# Patient Record
Sex: Female | Born: 1990 | Race: White | Hispanic: No | Marital: Single | Smoking: Current every day smoker
Health system: Southern US, Community
[De-identification: ages and names within clinical notes are randomized; demographics above are authoritative.]

## PROBLEM LIST (undated history)

## (undated) ENCOUNTER — Inpatient Hospital Stay (HOSPITAL_COMMUNITY): Payer: Self-pay

## (undated) DIAGNOSIS — N12 Tubulo-interstitial nephritis, not specified as acute or chronic: Secondary | ICD-10-CM

## (undated) DIAGNOSIS — R110 Nausea: Secondary | ICD-10-CM

## (undated) DIAGNOSIS — R109 Unspecified abdominal pain: Secondary | ICD-10-CM

## (undated) DIAGNOSIS — Z98891 History of uterine scar from previous surgery: Secondary | ICD-10-CM

## (undated) DIAGNOSIS — F329 Major depressive disorder, single episode, unspecified: Secondary | ICD-10-CM

## (undated) DIAGNOSIS — O26899 Other specified pregnancy related conditions, unspecified trimester: Secondary | ICD-10-CM

## (undated) DIAGNOSIS — Z6791 Unspecified blood type, Rh negative: Secondary | ICD-10-CM

## (undated) DIAGNOSIS — IMO0002 Reserved for concepts with insufficient information to code with codable children: Secondary | ICD-10-CM

## (undated) DIAGNOSIS — O09299 Supervision of pregnancy with other poor reproductive or obstetric history, unspecified trimester: Secondary | ICD-10-CM

## (undated) DIAGNOSIS — F419 Anxiety disorder, unspecified: Secondary | ICD-10-CM

## (undated) DIAGNOSIS — F32A Depression, unspecified: Secondary | ICD-10-CM

## (undated) HISTORY — PX: TONSILLECTOMY: SUR1361

## (undated) HISTORY — PX: ADENOIDECTOMY: SUR15

---
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
## 2011-01-27 ENCOUNTER — Other Ambulatory Visit: Payer: Self-pay | Admitting: Obstetrics and Gynecology

## 2011-01-27 ENCOUNTER — Encounter (HOSPITAL_COMMUNITY): Payer: Medicaid Other

## 2011-01-27 LAB — CBC
MCV: 93.2 fL (ref 78.0–100.0)
Platelets: 172 10*3/uL (ref 150–400)
RDW: 13.6 % (ref 11.5–15.5)
WBC: 9.6 10*3/uL (ref 4.0–10.5)

## 2011-01-27 LAB — SURGICAL PCR SCREEN: Staphylococcus aureus: NEGATIVE

## 2011-01-27 LAB — RPR: RPR Ser Ql: NONREACTIVE

## 2011-01-28 NOTE — Op Note (Signed)
[facility]  Patient:    Lynn Nolan, Lynn Nolan                   MRN: [id number] Proc. Date: 08/14/00 Adm. Date:  [id number] Attending:  Susy Frizzle CC:         Theron Arista _________, M.D.   Operative Report  PREOPERATIVE DIAGNOSIS:  Obstructive tonsils and adenoid hypertrophy.  POSTOPERATIVE DIAGNOSIS:  Obstructive tonsils and adenoid hypertrophy.  PROCEDURE:  Tonsillectomy and adenoidectomy.  SURGEON:  Jefry H. Pollyann Kennedy, M.D.  ANESTHESIA:  General endotracheal.  COMPLICATIONS:  None.  The patient tolerated the procedure well; was awakened, extubated and transferred to recovery room in stable condition.  FINDINGS:  Narrowed pharynx with moderate enlargement of the tonsils and moderate to severe enlargement of the adenoids, with obstruction of the nasopharynx.  ESTIMATED BLOOD LOSS:  10 cc.  INDICATIONS FOR PROCEDURE:  This is an 20-year-old with chronic snoring, obstructive breathing, and large tonsils and adenoids.  Risks, benefits and alternatives and complications of the procedure were explained to the parents. They seemed to understand and agreed to surgery.  DESCRIPTION OF PROCEDURE:  The patient was taken to the operating room and placed on the operating table in the supine position.  Following the induction of general endotracheal anesthesia, the table was turned 90 degrees and the patient was draped in the standard fashion.  Crowe-Davis mouth gag was inserted into the oral cavity, used to retract the tongue and mandible and attach the Mayo stand.  A red rubber catheter was inserted into the right side of the nose, withdrawn through the mouth, and used to retract the soft palate and uvula.  Inspection of the palate revealed no evidence of a submucosal cleft or shortening of the soft palate.  Indirect examination of the nasopharynx was performed, and a large adenoid curet was used to remove the adenoid tissue. The nasopharynx was then  packed.  Tonsillectomy was performed using electrocautery dissection, keeping the capsule intact.  There was minimal bleeding encountered along the dissection.  Tonsils and adenoid tissues were sent together for pathologic evaluation.  The packing was removed from the nasopharynx, and suction cautery was used to proved hemostasis in the nasopharyngeal bed.  Pharynx was suctioned of blood and secretions, irrigated with saline solution, then a nasogastric tube was used to aspirate the contents in the stomach.  The patient was then awakened, extubated and transferred to recovery. DD:  08/14/00 TD:  08/14/00 Job: [id number]

## 2011-01-28 NOTE — Discharge Summary (Signed)
Lynn Nolan, Lynn Nolan          ACCOUNT NO.:  [id number]   MEDICAL RECORD NO.:  [id number]          PATIENT TYPE:  INP   LOCATION:  0104                          FACILITY:  [facility]   PHYSICIAN:  Lalla Brothers, MDDATE OF BIRTH:  [birth year]   DATE OF ADMISSION:  06/14/2006  DATE OF DISCHARGE:  06/21/2006                                 DISCHARGE SUMMARY   IDENTIFICATION:  20 year old female ninth grade student at [employer] was admitted emergently voluntarily in transfer from [facility] emergency department for inpatient stabilization and treatment of  suicide risk and depression.  The patient had overdosed with 18 of mother's  Vicodin as a suicide attempt the week after writing a suicide note in  response to which mother was setting up outpatient care with [facility] for  therapy.  The patient has had significant conflict with mother and  significant acting out behavior especially sexual activity with older males.  The patient has used cannabis at least several times. last reportedly 2  weeks ago as well as occasional alcohol the last 2 weeks ago.  For full  details please see the typed admission assessment.   SYNOPSIS OF PRESENT ILLNESS:  The patient has more than 6 months of risk-  taking and defiant behavior.  She resides with mother and 2 sisters,  though  she stayed with a maternal aunt for 3 weeks to decompress family stress and  tension.  The patient has been skipping school and grades have become  failing this year even though she makes excellent grades on EOGs.  The  patient wishes she were not in high school but rather back in middle school.  Paternal aunt has had a suicide attempt years ago.  Mother has anxiety and  depression including panic treated with Klonopin and SSRI.  Paternal aunt  has depression and anxiety.  Maternal grandfather had substance abuse with  alcohol and maternal uncle substance abuse with alcohol and drugs.  They  gradually subsequently clarify that biological father died when the patient  was age 64 without the patient knowing him.  Father was in a bar fight and  suffered a heart attack in the process,  being found in the parking lot  brain dead.  The patient identifies with biological father and is  projectively identified as being like him by mother.  The patient and mother  have significant conflicts with the patient being angry about mother's  choice of men over the years and the consequences for the family.  However  the patient seems to need nurturing and mother seems to have some difficulty  responding, though the patient certainly alienates her..  The patient has  some exercise-induced asthma but also smokes two or three cigarettes daily  for the last month.  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  Cousins and  aunt have ADHD.  The patient is stressed about having to testify against 39-  year-old ex-boyfriend with whom she was sexually active who is being charged  about such as the patient was just 65.   INITIAL MENTAL STATUS EXAM:  The  patient has diminished interest and energy.  She had significant hopelessness and agitation.  She denied significant  anxiety.  She was fixated in oppositionality while identifying with  biological father.  She had no manic diathesis evident though family  formulated the patient has bipolar disorder.  Stepfather would not claim the  patient or speak to her and always left.   LABORATORY FINDINGS:  In [facility] emergency department, the  patient's CBC was normal except hemoglobin 15 with upper limit of normal  14.6, likely hemoconcentration.  White count was normal at 8100, hematocrit  42.9, MCV of 89 and platelet count 224,000.  Basic metabolic panel was  normal with random glucose 113, sodium 138, potassium 3.6, creatinine 0.7,  and calcium 9.6.  Acetaminophen level was initially 46.7 dropping to 39.2 3  hours later with therapeutic range  being 10-30 mcg/mL.  Blood alcohol and  urine drug screen were negative in the emergency department.  Urine  pregnancy test was negative.  Urinalysis was normal except dilute specimen  with specific gravity of less than 1.005 with pH six.  Hepatic function  panel was normal with total bilirubin 1.1, albumin 3.8, AST 14 and ALT 14  with GGT 18.  Free T4 was normal at 1.32 and TSH 1.275.  RPR was  nonreactive.  Urine probe for gonorrhea and chlamydia trichomatous by DNA  amplification were both negative   HOSPITAL COURSE AND TREATMENT:  General medical exam by Jorje Guild PA-C noted  a history of rash to amoxicillin and penicillin.  The patient had pneumonia  requiring hospitalization at age [age] and she now smokes cigarettes.  She  was menstruating at the time of admission and had some mechanical right knee  pain.  She is sexually active occasionally using contraception, had recent  GYN exam and Dr. Lilian Coma office in August 2007.  She has exercise-induced  asthma.  Her admission height was 155.5 cm and weight 61 kg.  Blood pressure  was 122/74 with heart rate of 99 sitting and 126/87 with heart rate of 115  standing.  Vital signs were normal throughout hospital stay with discharge  blood pressure 108/67 with heart rate of 66 supine and standing blood  pressure 102/74 with heart rate of 113.  Final weight was 61 kg.  The  patient was started on Wellbutrin titrated up to 300 mg XL every morning.  Albuterol inhaler was available if needed for exertional asthma.  The  patient was initially resistant to treatment process and program and was  closed to communication.  She denied depression or the need for treatment.  The patient gradually opened up in the treatment program and process.  She  noted that having both parents is very important and that the loss of her  father who left when she was 33 years of age so that she did not know him was traumatic.  She began to address more realistically the  effects of peer  pressure and sex upon relationships.  She concluded she wanted to pass out  more than suicide from the overdose and that drugs were dumb.  The patient  gradually made progress in her understanding of her symptoms so that she  could acknowledge the benefit of Wellbutrin by the time of discharge.  She  was tolerating medication well and had no side effects including no suicide  related side effects, pre seizure sign or symptom or hypomania or over  activation.  In the final family therapy session with mother,  the patient  was able to open up to mother about her disappointment with mother's choice  of men through the years and the  negative effect upon the family and  herself.  She noted to mother that she wanted to have a relationship but she  needed mother to stop talking to others about the patient's feelings and  symptoms which mother acknowledged that she does.  The patient cried as she  related these issues with mother and even more when she told mother that she  could not testify in court.  The patient ended the session telling mother  she really  wanted to work on the relationship and mother attempted to  assure the patient that everything would be okay and nothing was her fault.  Still mother had difficulty extending nurturing and the patient apparently  regressed shortly after release, becoming upset with mother when visiting  the aunt's house.  Mother called back the following day that she had held  the patient's Wellbutrin because the patient reported it was making her have  difficulty breathing and the aunt  thought she might be allergic.  The  patient then became more anxious and mother was going to give the patient  one of her Klonopin.  The patient was in [facility] emergency room  again 2 days after discharge with she and mother reporting the patient was  having panic anxiety and wanting a different medication such that the PA was  willing to  restructure medication to suit the patient and mother, while  encouraging their building of relationship for coping the way she was able  to decompress and resolve her anxiety in the emergency department.  The  patient required no seclusion or restraint during hospital stay.   FINAL DIAGNOSES:  AXIS I:  1. Major depression, single episode, severe with atypical features.  2. Oppositional defiant disorder.  3. Rule out psychoactive substance abuse not otherwise specified      (provisional diagnosis).  4. Rule out attention deficit hyperactivity disorder not otherwise      specified (provisional diagnosis).  5. Parent child problem.  6. Other specified family circumstances.  7. Other interpersonal problem  AXIS II: Diagnosis deferred.  AXIS III:  1. Exercise-induced asthma.  2. Allergy to penicillin and amoxicillin manifested by rash.  3. Vicodin overdose.  4. Limited cigarette smoking. AXIS IV: Stressors family severe acute and chronic; phase of life severe  acute and chronic; school moderate acute and chronic  AXIS V: Global assessment of functioning on admission 59 with highest in  last year estimated at 94 and discharge GAF was 52.   PLAN:  The patient was discharged to mother in improved condition free of  suicidal ideation.  Crisis and safety plans are outlined if needed.  The  patient follows a regular diet and has no restrictions on physical activity.  At the time of discharge, the patient was prescribed Wellbutrin 300 mg XL  every morning quantity #30 with one refill and her albuterol inhaler was  sent home with the patient to use 2 puffs up to every 4 hours if needed for  asthma.  They have intake with Tretha Sciara at [facility] 06/23/2006 at  11:00 a.m. at [phone].      Lalla Brothers, MD  Electronically Signed     GEJ/MEDQ  D:  06/23/2006  T:  06/25/2006  Job:  [id number]   cc:   Tretha Sciara, fax: [phone]

## 2011-01-28 NOTE — H&P (Signed)
[name], [name]          ACCOUNT NO.:  [id number]   MEDICAL RECORD NO.:  [id number]          PATIENT TYPE:  INP   LOCATION:  0104                          FACILITY:  [facility]   PHYSICIAN:  Lalla Brothers, MDDATE OF BIRTH:  22-Apr-1991   DATE OF ADMISSION:  06/14/2006  DATE OF DISCHARGE:                         PSYCHIATRIC ADMISSION ASSESSMENT   IDENTIFICATION:  A 20 year old female, ninth grade student at [employer], is admitted emergently voluntarily in transfer from  [facility] emergency department for inpatient psychiatric  stabilization and treatment of suicide risk and depression.  The patient's  transfer is delayed from time of referral by the course of medical  evaluation and certainty of stabilization.  The patient's acetaminophen  level did drop from the low 40s to the high 30s in the course of the  emergency room assessment and intervention.  She had overdosed with 18 of  mother's Vicodin as a suicide attempt, after writing a suicide note the  preceding week.  Mother has set up outpatient care at [facility] though the  patient overdosed before the scheduled appointment and became available.  The patient has significant conflict with mother and it is not possible to  establish safety or containment in a less restricted manner as determined by  the ACT team counselor in the emergency department.  The only other mental  health discussion has been with the school counselor on a very limited basis  so that essentially the patient is previously untreated.   HISTORY OF PRESENT ILLNESS:  The patient has limited trust for adults.  She  has been dating an 18 year old boyfriend in the not-too-distant past and  they were apparently sexually active when she was 54.  Legal proceedings are  underway with court next week for this boy.  The patient no longer dates  that boy and is now dating a 19 year old but is again sexually active with  him.  The  patient states that she chooses a lot of unfavorable relationships  in this way and she has nothing else to say about it except that it is hard  on her that she keeps doing it.  The patient, herself, apparently receives  few consequences that she can enumerate, except that she and mother stay in  conflict.  The patient seems to identify with biological father, though she  is living with mother and has an aunt who is supportive as well as for 69 and  89-year-old sisters.  The patient has significant externalizing and  oppositional behavior.  She initially denied any substance use but has been  intoxicated at times that she gradually acknowledges.  She has used cannabis  at least several times with last use being 2 weeks ago.  Her last use of  alcohol was 2 weeks ago.  She is smoking 2-3 cigarettes daily for the last  month.  Her period of time associating with undesirable peers and placing  herself in risk-taking situations has been greater than 6 months.  The  patient does not appreciate that she has mounting relationship and emotional  consequences from such risk-taking behavior.  She finds sensation  seeking to  continue to escalate even though consequences are present.  The patient has  become more and more dysphoric and negativistic.  She is now finding it  difficult to interest herself in things or to achieve a sense of pleasure or  satisfaction.  Her suicide attempt and ideation, may in some way tend to  identify with father's death.  Father died when she was approximately 55  years of age having gotten in a fight in a bar and then being found outside  in the parking lot having had a heart attack, likely triggered in the fight.  By the time emergency crews got to the father, he was brain dead according  to the patient.  She states she never knew her father.  There is reportedly  a family history of bipolar disorder and ADHD.  The patient dislikes school  but can do reasonably well  academically.  She would prefer to be at her old  middle school instead of the new [employer].  She alienates peers and has  frequent conflicts with peers at school as well.  She has had runaway  behavior.  The patient has had no hypomanic or manic mood and no psychotic  symptoms.  She has had no known organic central nervous system trauma.   She is on no regular medications, taking only ibuprofen when needed such as  for mechanical back pain.   PAST MEDICAL HISTORY:  1. The patient is under the primary care of [facility].  2. She has a history of heart murmur when she was young that apparently      resolved and was benign.  3. She had anemia at age 49 months with a hemoglobin as low as 5.5.  4. She has had exercise-induced asthma.  5. She had pneumonia, requiring inpatient care around age [age].  6. She subsequently had tonsillectomy and adenoidectomy.   SHE IS ALLERGIC TO:  PENICILLIN.  AMOXICILLIN MANIFESTED BY RASH.   She is sexually active and her last menses is current this month in October.   She has a mechanical back pain.  She uses ibuprofen as needed for the  mechanical back pain or other simple pains.   She does smoke 2-3 cigarettes daily for the last month.   She has had no seizure or syncope.  She has had no arrhythmia or other  traumatic brain injury.   REVIEW OF SYSTEMS:  The patient denies difficulty with gait, gaze, or  continence.  She denies headache or sensory loss currently.  She has no  memory loss or coordination deficit.  She has no known exposure to  communicable disease or toxins.  There is no rash, jaundice or purpura.  There is no chest pain, palpitations, or presyncope currently.  There is no  abdominal pain, nausea, vomiting or diarrhea.  There is no dysuria or  arthralgia.   IMMUNIZATIONS:  Up-to-date.   FAMILY HISTORY:  Mother and aunt are supportive.  She never knew biological father.  There is an aunt who attempted suicide years  ago and a family  history of bipolar disorder and ADHD.  Maternal aunt has anxiety and  depression.  Maternal grandfather has substance abuse with alcohol.  Cousins  and aunt have ADHD.  Sisters are ages four and 89.  Father had substance  abuse with alcohol and drugs.  Father died when the patient was [age],  apparently having a heart attack during a fight in a bar and dying in the  parking lot.   SOCIAL DEVELOPMENTAL HISTORY:  The patient is a ninth grade student at  [employer].  She dislikes school and wishes she can go  back to middle school.  An 48 year old ex-boyfriend is being prosecuted in  court next week for his sexual activity with the patient when she was 59  years of age.  The patient now has a 18 year old boyfriend with whom she is  also sexually active.  The patient has conflicts with mother and peers at  school.  She has unresolved grief and loss as well as unresolved social  conflicts all of which may have magnification by the patient not knowing  rather than knowing too much the course of her development.  However, the  patient is hesitant to ask questions or to look into the concerns and she  had some limited substance abuse as well as being sexually active.  She does  not know of any legal charges against herself currently.   ASSETS:  The patient is effective academically, even though there is a  family history of ADHD.  The patient reports that she loves mother even  though she has many conflicts and arguments with mother.   PHYSICAL EXAMINATION:  VITAL SIGNS:  Height is 155.5-cm and weight is 61 kg.  Blood pressure is 122/74 with a heart rate of 99 sitting, and 126/87 with  heart rate of 115 standing.  NEUROLOGIC:  She is right-handed.  She is alert and oriented with speech  intact.  Cranial nerves II-XII intact.  Muscle strength and tone are normal.  AMR is a 0/0.  There are no pathologic reflexes or soft neurologic findings.  There are no  abnormal involuntary movements.  Gait and gaze are intact are  intact.  MENTAL STATUS:  The patient is withdrawn, somber, quiet, and reserved.  There is diminished interest and energy.  She has significant hopelessness  and agitation.  She has no significant anxiety.  She is fixated in her  oppositionality and despair and identifies with deceased father in this way.  She has conflict with mother by this identification and behavior.  She has  no evidence of psychosis and no manic symptoms.  She has had suicide attempt  by overdose and had written a suicide note 1 week ago that was very angry  and mother's recapitulation but do not have the actual note yet to integrate  in therapeutic understanding.   IMPRESSION:   AXIS I:  1. Major depression, single episode, severe with atypical features.  2. Oppositional defiant disorder.  3. Parent child problem.  4. Other specified family circumstances  5. Other interpersonal problem. 6. Rule out psychoactive substance abuse, not otherwise specified      (provisional diagnosis).  7. Rule out attention deficit hyperactivity disorder, not otherwise      specified (provisional diagnosis).   AXIS II:  Diagnosis deferred.   AXIS III:  1. Exercise-induced asthma.  2. Allergy to penicillin and amoxicillin manifested by rash.  3. Limited cigarette smoking.   AXIS IV:  Stressors:  Family, severe acute and chronic; phase of life,  severe acute and chronic; school, moderate acute and chronic.   AXIS V:  Global assessment of functioning on admission 34, with the highest  in the last year estimated at 72.   PLAN:  The patient is admitted for inpatient adolescent psychiatric and  multidisciplinary multimodal behavioral treatment in a team-based  problematic locked psychiatric unit.  We will consider Wellbutrin  pharmacotherapy.  The patient is just getting over the overdose and does not  manifest definite acetaminophen or opiate toxicity currently.   The patient  emphasizes that the overdose did not kill her.  Cognitive behavioral  therapy, anger management, family therapy, substance abuse prevention,  empathy training, identity consolidation, grief and loss, and social skills  and communication training can be undertaken.  Estimated length stay is 7  days with target symptoms for discharge being stabilization of suicide risk  and mood, stabilization of disruptive behavior and resistance to therapeutic  stabilization, and generalization of the capacity for safe effective  participation in outpatient treatment.      Lalla Brothers, MD  Electronically Signed     GEJ/MEDQ  D:  06/15/2006  T:  06/16/2006  Job:  [id number]

## 2011-01-31 ENCOUNTER — Inpatient Hospital Stay (HOSPITAL_COMMUNITY)
Admission: RE | Admit: 2011-01-31 | Discharge: 2011-02-03 | DRG: 766 | Disposition: A | Discharge status: Home / Self Care | Payer: Medicaid Other | Source: Ambulatory Visit | Attending: Obstetrics and Gynecology | Admitting: Obstetrics and Gynecology

## 2011-01-31 DIAGNOSIS — O34219 Maternal care for unspecified type scar from previous cesarean delivery: Principal | ICD-10-CM | POA: Diagnosis present

## 2011-01-31 DIAGNOSIS — O3660X0 Maternal care for excessive fetal growth, unspecified trimester, not applicable or unspecified: Secondary | ICD-10-CM | POA: Diagnosis present

## 2011-02-01 LAB — CBC
HCT: 34.8 % — ABNORMAL LOW (ref 36.0–46.0)
MCHC: 33.6 g/dL (ref 30.0–36.0)
Platelets: 150 10*3/uL (ref 150–400)
RDW: 13.5 % (ref 11.5–15.5)

## 2011-02-02 LAB — RH IMMUNE GLOB WKUP(>/=20WKS)(NOT WOMEN'S HOSP): Fetal Screen: NEGATIVE

## 2011-02-02 NOTE — Op Note (Signed)
Lynn Nolan, Lynn Nolan          ACCOUNT NO.:  [id number]  MEDICAL RECORD NO.:  [id number]           PATIENT TYPE:  I  LOCATION:  9107                          [facility]:  [facility]  PHYSICIAN:  Crist Fat. Ridley Schewe, M.D. DATE OF BIRTH:  Jan 13, 1991  DATE OF PROCEDURE:  01/31/2011 DATE OF DISCHARGE:                              OPERATIVE REPORT   PREOPERATIVE DIAGNOSIS:  Intrauterine pregnancy at 40 weeks and 5 days with previous cesarean section.  POSTOPERATIVE DIAGNOSIS:  Intrauterine pregnancy at 40 weeks and 5 days with previous cesarean section.  ANESTHESIA:  Spinal, Dr. Arby Barrette.  PROCEDURE:  Repeat low transverse cesarean section.  SURGEON:  Crist Fat. Arvis Miguez, MD  ASSISTANT:  Larna Daughters, certified nurse midwife.  ESTIMATED BLOOD LOSS:  800 mL.  PROCEDURE:  After being informed of the planned procedure with possible complications including bleeding, infection, and injury to other organs, informed consent was obtained.  The patient was taken to OR #1, given spinal anesthesia without complication and placed in the dorsal decubitus position, pelvis tilted to the left.  She was prepped and draped in a sterile fashion and a Foley catheter was inserted in her bladder.  After assessing adequate level of anesthesia, we infiltrated the Pfannenstiel incision using 20 mL of Marcaine 0.25 and performed a Pfannenstiel incision which was brought down sharply to the fascia.  The fascia was incised in a low transverse fashion.  Linea alba was dissected.  Peritoneum was entered bluntly.  An Alexis retractor was easily positioned which allowed [place] to open the visceral peritoneum in a low transverse fashion so that we could safely retract bladder by developing a bladder flap.  The myometrium was then entered in a low transverse fashion first with knife, then extended bluntly.  Amniotic fluid was clear.  We assisted the birth of a female infant in vertex presentation at 3:51 p.m.  Mouth  and nose were suctioned.  Baby was delivered.  Cord was clamped with two Kelly clamps and sectioned and baby was given to the neonatologist present in the room.  10 mL of blood was drawn from the umbilical vein and the placenta was allowed to deliver spontaneously.  It was complete.  Cord has 3 vessels and uterine revision was negative.  We proceeded with closure of the myometrium in 2 layers first with a running lock suture of 0 Vicryl, then with a Lembert suture of 0 Vicryl imbricating the first one.  Hemostasis was completed in the right angle with a figure-of-eight stitch of 0 Vicryl and on peritoneal edges using cauterization.  Both paracolic gutters were cleaned.  Both tubes and ovaries were assessed and normal.  The pelvis was then profusely irrigated with warm saline to confirm a satisfactory hemostasis. Sponges and retractors were removed.  Under fascia, hemostasis was completed with cauterization and the fascia was then closed with two running sutures of #1 Vicryl meeting midline. The wound was irrigated with warm saline.  Hemostasis was completed with cauterization and the skin was closed with a subcuticular suture of 3-0 Monocryl and Steri-Strips.  Instrument and sponge count was complete x2.  Estimated blood loss was 800 mL.  The  procedure was well tolerated by the patient who was taken to recovery room in a well and stable condition.  Little boy unnamed at this time, was born at 3:51 p.m., received an Apgar of 9 at 1 minute and 9 at 5 minutes, and weight 9 pounds.  SPECIMEN:  Placenta sent to Labor and Delivery.     Crist Fat Jamisen Hawes, M.D.     SAR/MEDQ  D:  01/31/2011  T:  02/01/2011  Job:  [id number]  Electronically Signed by Silverio Lay M.D. on 02/02/2011 07:21:59 AM

## 2011-03-27 NOTE — Discharge Summary (Signed)
NAMEGITEL, Lynn Nolan          ACCOUNT NO.:  [id number]  MEDICAL RECORD NO.:  [id number]  LOCATION:  9107                          [facility]  [facility]  PHYSICIAN:  Osborn Coho, M.D.   DATE OF BIRTH:  [birth year]  DATE OF ADMISSION:  01/31/2011 DATE OF DISCHARGE:  02/03/2011                              DISCHARGE SUMMARY   HOSPITAL PROCEDURES: 1. Spinal anesthesia. 2. Repeat low transverse cesarean section. 3. Rhophylac studies.  ADMITTING DIAGNOSES: 1. Intrauterine pregnancy at 40 weeks and 5 days. 2. Previous cesarean section with desire for repeat. 3. Rh negative. 4. Group B strep negative. 5. History of anxiety and depression, on no meds. 6. Older son with history of spinal dermal cyst.  DISCHARGE DIAGNOSES: 1. Stable postoperative day #3, status post repeat low transverse     cesarean section with findings of a viable female infant born [birth year] 2011, at 15:51, birth weight 9 pounds even (4080 grams), vertex     presentation, clear fluid, and Apgars were nine at 1 minute and 9     at five minutes. 2. Large for gestational age. 3. Breastfeeding. 4. History of postpartum depression and the patient restarted on     Zoloft 50 mg p.o. daily since delivery. 5. Closely-spaced pregnancies. 45. 20-years of age  HOSPITAL COURSE:  Lynn Nolan is an 20 year old gravida 2, para 1-0-0- 1, who presented on the date of admission at 40 weeks and 5 days per an [facility] of Jan 26, 2011, for a scheduled repeat low transverse cesarean section.  Reported occasional contractions.  No leakage of fluid or vaginal bleeding.  Did report good fetal movement.  She had been followed by [facility] at [facility] with onset of care at 17 weeks and her pregnancy had been remarkable for: 1. Previous C-section for breech, desiring repeat. 2. Closely-spaced pregnancies. 3. History of postpartum depression after previous delivery, but on no     meds during the pregnancy or at the time of admission. 4. History  of Chlamydia. 5. PENICILLIN allergy. 6. Late to care. 7. Rh negative. 8. History of pyelonephritis. 9. Smoker. 10.History of anxiety.  On date of admission, she was afebrile.  Her vital signs were stable.  Her physical exam was within normal limits.  CBC on Jan 27, 2011, showed white blood cell count normal at 9.6, hemoglobin 13.5, hematocrit 39.7, and platelets were 172.  RPR was nonreactive and her preoperative MRSA PCR screen was negative.  On the date of admission, she was admitted to [facility] with routine preoperative orders under attending Dr. Dois Davenport Rivard.  Options of VBAC versus TOLAC were reviewed and risks, benefits, alternatives of these were reviewed.  Risk of surgery were discussed which included, but were not limited to: 1. Bleeding. 2. Infection. 3. Damage to surrounding organs. 4. Possible anesthesia complications.  Following this discussion, the patient continued verbalizing desire to proceed with repeat cesarean section with lack of spontaneous labor.  The patient was prepped and taken to the OR where a repeat low transverse cesarean section was performed under spinal anesthesia by Dr. Dois Davenport Rivard attending, assisted by [facility], certified nurse midwife.  Please see Dr. Cloretta Ned operative dictation note for  specifics of the surgery.  However, I will note that findings were a live female infant born Jan 31, 2010, at 15:51 at 40 weeks and 5 days, vertex presentation, clear fluid, weight 9 pounds even (4080 grams) and Apgars were nine at 1 minute and nine at 5 minutes.  The patient did tolerate the procedure well and was taken to the PACU in well and stable condition and newborn to full-term nursery.  By postoperative day #1, which was Feb 01, 2011, the patient had ambulated twice without difficulty.  She had voided since Foley was removed.  She was tolerating p.o.'s, adequate pain control with minimal incisional pain.  She was afebrile.   Vital signs were stable.  CBC on postop day #1, white count was 10.9, hemoglobin was 10.9, hematocrit 34.8, and platelets were stable at 150.  She did have hypoactive bowel sounds in her upper quadrants, active bowel sounds were present in her lower quadrants.  Incision was clean, dry and intact.  She had small rubra lochia.  Fundus was firm below umbilicus.  Routine postoperative care did continue.  Her plan was for outpatient circ as well as Nexplanon. On postop day #1 when Dr. Stefano Gaul did round on the patient, the patient did request to restart psyche medication and she was started that day on Zoloft 50 mg p.o. daily.  Did have breastfeeding/lactation consult on Feb 02, 2011, which was postop day #2. Did feel a little bit sore that day, but was using Motrin and Percocet with benefit, positive flatulence, breast feeding was going well.  She was afebrile.  Vital signs were stable.  Her physical exam was within normal limits with the exception of some fullness in her abdomen that was slightly tympanic on exam.  Steri-Strips were clean, dry and intact at incision.  Fundus was firm below umbilicus.  Scant rubra lochia.  Dr. Normand Sloop rounded on the patient right before lunch that day and the patient did complain of some difficulty focusing one of her eyes.  No scotoma. PERLLA was within normal limits and Dr. Normand Sloop did note that she would consult with Ophthalmology and probably would follow up as an outpatient regarding this complaint.  Social work did opt out of screening the patient secondary to the restart of the Zoloft postpartum.  By postoperative day #3 which was Feb 03, 2011, the patient continued doing well.  She was up without dizziness.  Pain was managed with Motrin and Percocet; nursing was going well.  She was voiding without difficulty.  Did report that she thought maybe newborn was having a possible allergy to diaper.  She remained afebrile.  Vital signs were stable.  Her  physical exam was within normal limits.  Did have some lower extremity 1+ pitting edema, but otherwise was within normal limits.  The patient was deemed to have received full benefit of the hospital stay and was discharged home postoperative day #3 on Feb 03, 2011, in stable condition. Discharge instructions were per [facility] pamphlet.  Warning signs and symptoms to report were reviewed.  DISCHARGE MEDICATIONS: 1. Zoloft 50 mg 1 tablet p.o. daily. 2. Percocet 5/325 one tablet p.o. q.3 h. or 2 tablets p.o. q.6 h.     p.r.n. moderate-to-severe pain. 3. Motrin 600 mg 1 tablet p.o. q.6 h. p.r.n. pain. 4. Colace or stool softener of choice to obtain over the counter while     on Percocet and she was to continue her prenatal vitamin 1 tablet     p.o. daily.  Additionally, the patient was given a pamphlet on postpartum depression signs and symptoms.  She was also given a discharge instruction sheet on receiving outpatient circumcision prior to newborn turning 82-month-old at [facility].  Her discharge followup for her care was to be at 6 weeks at [facility] or as needed.     Adonnis Salceda Denny Levy, CNM   ______________________________ Osborn Coho, M.D.    CHS/MEDQ  D:  03/26/2011  T:  03/27/2011  Job:  [id number]

## 2011-08-19 ENCOUNTER — Encounter: Payer: Self-pay | Admitting: *Deleted

## 2011-08-19 ENCOUNTER — Emergency Department (HOSPITAL_COMMUNITY)
Admission: EM | Admit: 2011-08-19 | Discharge: 2011-08-19 | Disposition: A | Discharge status: Home / Self Care | Payer: Medicaid Other | Attending: Emergency Medicine | Admitting: Emergency Medicine

## 2011-08-19 DIAGNOSIS — F17200 Nicotine dependence, unspecified, uncomplicated: Secondary | ICD-10-CM | POA: Insufficient documentation

## 2011-08-19 DIAGNOSIS — J111 Influenza due to unidentified influenza virus with other respiratory manifestations: Secondary | ICD-10-CM | POA: Insufficient documentation

## 2011-08-19 MED ORDER — OSELTAMIVIR PHOSPHATE 75 MG PO CAPS: 75.0000 mg | ORAL_CAPSULE | Freq: Two times a day (BID) | ORAL | Status: AC

## 2011-08-19 MED ORDER — ACETAMINOPHEN 500 MG PO TABS
1000.0000 mg | ORAL_TABLET | Freq: Once | ORAL | Status: AC
  Administered 2011-08-19: 1000 mg via ORAL
  Filled 2011-08-19: qty 2

## 2011-08-19 MED ORDER — LIDOCAINE VISCOUS 2 % MT SOLN: OROMUCOSAL | Status: DC

## 2011-08-19 NOTE — ED Notes (Signed)
Pt c/o sore throat, chest pain, and fever x 3 days

## 2011-08-19 NOTE — ED Notes (Signed)
Pt a/ox4. Resp even and unlabored. NAD at this time. D/C instruction and Rx x2 reviewed with pt. Pt verbalized understanding.

## 2011-08-19 NOTE — ED Provider Notes (Signed)
Medical screening examination/treatment/procedure(s) were performed by non-physician practitioner and as supervising physician I was immediately available for consultation/collaboration.  Donnetta Hutching, MD 08/19/11 1524

## 2011-08-19 NOTE — ED Notes (Addendum)
Pt presents with sore throat, chest pain, ans fever x 3 days. NAD at this time. Pt stable.

## 2011-08-19 NOTE — ED Provider Notes (Signed)
History     CSN: [id number] Arrival date & time: 08/19/2011  1:40 PM   First MD Initiated Contact with Patient 08/19/11 1349      Chief Complaint  Patient presents with  . Sore Throat  . Nasal Congestion  . Fever    (Consider location/radiation/quality/duration/timing/severity/associated sxs/prior treatment) Patient is a 20 y.o. female presenting with fever.  Fever Primary symptoms of the febrile illness include fever, cough and myalgias. Primary symptoms do not include headaches, shortness of breath, abdominal pain, nausea, arthralgias or rash. The current episode started 3 to 5 days ago. This is a new problem. The problem has been gradually worsening.  The fever began 3 to 5 days ago. The fever has been unchanged since its onset. The maximum temperature recorded prior to her arrival was 102 to 102.9 F.  The cough began 3 to 5 days ago. The cough is new. The cough is productive. The sputum is white.  Myalgias began 3 to 5 days ago. The myalgias have been unchanged since their onset. The myalgias are generalized. The discomfort from the myalgias is mild. The myalgias are not associated with weakness.  Risk factors: Her one year old child is currently being treated for the flu.  she has not had any other sick contacts.Primary symptoms comment: She also reports nasal congestion and severe sore throat.    History reviewed. No pertinent past medical history.  Past Surgical History  Procedure Date  . Tonsillectomy   . Cesarean section x2  . Adenoidectomy     History reviewed. No pertinent family history.  History  Substance Use Topics  . Smoking status: Current Everyday Smoker -- 0.5 packs/day    Types: Cigarettes  . Smokeless tobacco: Not on file  . Alcohol Use: No    OB History    Grav Para Term Preterm Abortions TAB SAB Ect Mult Living                  Review of Systems  Constitutional: Positive for fever.  HENT: Positive for congestion and sore throat. Negative for  neck pain.   Eyes: Negative.   Respiratory: Positive for cough. Negative for chest tightness and shortness of breath.   Cardiovascular: Negative for chest pain.  Gastrointestinal: Negative for nausea and abdominal pain.  Genitourinary: Negative.   Musculoskeletal: Positive for myalgias. Negative for joint swelling and arthralgias.  Skin: Negative.  Negative for rash and wound.  Neurological: Negative for dizziness, weakness, light-headedness, numbness and headaches.  Hematological: Negative.   Psychiatric/Behavioral: Negative.     Allergies  Amoxicillin; Erythrocin; Penicillins; and Wellbutrin  Home Medications   Current Outpatient Rx  Name Route Sig Dispense Refill  . LIDOCAINE VISCOUS 2 % MT SOLN  Gargle and spit qid prn pain 100 mL 0  . OSELTAMIVIR PHOSPHATE 75 MG PO CAPS Oral Take 1 capsule (75 mg total) by mouth 2 (two) times daily. 10 capsule 0    BP 110/67  Pulse 113  Temp(Src) 98.3 F (36.8 C) (Oral)  Ht 5\' 2"  (1.575 m)  SpO2 100%  Physical Exam  Nursing note and vitals reviewed. Constitutional: She is oriented to person, place, and time. She appears well-developed and well-nourished.  HENT:  Head: Normocephalic and atraumatic.  Eyes: Conjunctivae are normal.  Neck: Normal range of motion.  Cardiovascular: Normal rate, regular rhythm, normal heart sounds and intact distal pulses.   Pulmonary/Chest: Effort normal and breath sounds normal. No respiratory distress. She has no wheezes. She has no rales.  Occasional cough.  All lung fields are clear.  Abdominal: Soft. Bowel sounds are normal. There is no tenderness.  Musculoskeletal: Normal range of motion.  Neurological: She is alert and oriented to person, place, and time.  Skin: Skin is warm and dry.  Psychiatric: She has a normal mood and affect.    ED Course  Procedures (including critical care time)  Labs Reviewed - No data to display No results found.   1. Influenza       MDM  Tamiflu,   Lidocaine.  Rest,  Fluids,  Tylenol or motrin prn.        Candis Musa, PA 08/19/11 [phone]

## 2011-12-12 ENCOUNTER — Encounter (HOSPITAL_COMMUNITY): Payer: Self-pay | Admitting: *Deleted

## 2011-12-12 ENCOUNTER — Emergency Department (HOSPITAL_COMMUNITY)
Admission: EM | Admit: 2011-12-12 | Discharge: 2011-12-12 | Disposition: A | Discharge status: Home / Self Care | Payer: Medicaid Other | Attending: Emergency Medicine | Admitting: Emergency Medicine

## 2011-12-12 DIAGNOSIS — F17200 Nicotine dependence, unspecified, uncomplicated: Secondary | ICD-10-CM | POA: Insufficient documentation

## 2011-12-12 DIAGNOSIS — B9689 Other specified bacterial agents as the cause of diseases classified elsewhere: Secondary | ICD-10-CM | POA: Insufficient documentation

## 2011-12-12 DIAGNOSIS — N760 Acute vaginitis: Secondary | ICD-10-CM | POA: Insufficient documentation

## 2011-12-12 DIAGNOSIS — R197 Diarrhea, unspecified: Secondary | ICD-10-CM | POA: Insufficient documentation

## 2011-12-12 DIAGNOSIS — A599 Trichomoniasis, unspecified: Secondary | ICD-10-CM

## 2011-12-12 DIAGNOSIS — A499 Bacterial infection, unspecified: Secondary | ICD-10-CM | POA: Insufficient documentation

## 2011-12-12 DIAGNOSIS — R112 Nausea with vomiting, unspecified: Secondary | ICD-10-CM

## 2011-12-12 LAB — POCT I-STAT, CHEM 8
Chloride: 111 mEq/L (ref 96–112)
Creatinine, Ser: 0.9 mg/dL (ref 0.50–1.10)
HCT: 51 % — ABNORMAL HIGH (ref 36.0–46.0)
Hemoglobin: 17.3 g/dL — ABNORMAL HIGH (ref 12.0–15.0)
Potassium: 4.2 mEq/L (ref 3.5–5.1)
Sodium: 145 mEq/L (ref 135–145)

## 2011-12-12 LAB — URINALYSIS, ROUTINE W REFLEX MICROSCOPIC
Hgb urine dipstick: NEGATIVE
Nitrite: NEGATIVE
Specific Gravity, Urine: 1.01 (ref 1.005–1.030)
Urobilinogen, UA: 0.2 mg/dL (ref 0.0–1.0)
pH: 7 (ref 5.0–8.0)

## 2011-12-12 LAB — URINE MICROSCOPIC-ADD ON

## 2011-12-12 MED ORDER — METRONIDAZOLE 500 MG PO TABS: 500.0000 mg | ORAL_TABLET | Freq: Two times a day (BID) | ORAL | Status: AC

## 2011-12-12 MED ORDER — ONDANSETRON 4 MG PO TBDP: 4.0000 mg | ORAL_TABLET | Freq: Four times a day (QID) | ORAL | Status: AC | PRN

## 2011-12-12 MED ORDER — ONDANSETRON HCL 4 MG/2ML IJ SOLN
4.0000 mg | INTRAMUSCULAR | Status: AC | PRN
  Administered 2011-12-12 (×2): 4 mg via INTRAVENOUS
  Filled 2011-12-12 (×2): qty 2

## 2011-12-12 MED ORDER — FAMOTIDINE IN NACL 20-0.9 MG/50ML-% IV SOLN
20.0000 mg | Freq: Once | INTRAVENOUS | Status: AC
  Administered 2011-12-12: 20 mg via INTRAVENOUS
  Filled 2011-12-12: qty 50

## 2011-12-12 MED ORDER — SODIUM CHLORIDE 0.9 % IV SOLN
Freq: Once | INTRAVENOUS | Status: AC
  Administered 2011-12-12: 03:00:00 via INTRAVENOUS

## 2011-12-12 MED ORDER — ONDANSETRON 8 MG PO TBDP
8.0000 mg | ORAL_TABLET | Freq: Once | ORAL | Status: AC
  Administered 2011-12-12: 8 mg via ORAL
  Filled 2011-12-12: qty 1

## 2011-12-12 NOTE — ED Provider Notes (Signed)
History     CSN: [id number]  Arrival date & time 12/12/11  0200   First MD Initiated Contact with Patient 12/12/11 [phone]      Chief Complaint  Patient presents with  . Nausea  . Emesis  . Diarrhea    HPI Pt was seen at 0250.  Per pt, c/o gradual onset and persistence of multiple intermittent episodes of N/V/D since last night.  Has been associated with generalized abd "cramping" pain.  +children in household with same symptoms.  Denies black or blood in stools or emesis, no back pain, no dysuria, no fevers.   History reviewed. No pertinent past medical history.  Past Surgical History  Procedure Date  . Tonsillectomy   . Cesarean section x2  . Adenoidectomy     History  Substance Use Topics  . Smoking status: Current Everyday Smoker -- 0.5 packs/day    Types: Cigarettes  . Smokeless tobacco: Not on file  . Alcohol Use: No    Review of Systems ROS: Statement: All systems negative except as marked or noted in the HPI; Constitutional: Negative for fever and chills. ; ; Eyes: Negative for eye pain, redness and discharge. ; ; ENMT: Negative for ear pain, hoarseness, nasal congestion, sinus pressure and sore throat. ; ; Cardiovascular: Negative for chest pain, palpitations, diaphoresis, dyspnea and peripheral edema. ; ; Respiratory: Negative for cough, wheezing and stridor. ; ; Gastrointestinal: +N/V/D. Negative for abdominal pain, blood in stool, hematemesis, jaundice and rectal bleeding. . ; ; Genitourinary: Negative for dysuria, flank pain and hematuria. ; ; Musculoskeletal: Negative for back pain and neck pain. Negative for swelling and trauma.; ; Skin: Negative for pruritus, rash, abrasions, blisters, bruising and skin lesion.; ; Neuro: Negative for headache, lightheadedness and neck stiffness. Negative for weakness, altered level of consciousness , altered mental status, extremity weakness, paresthesias, involuntary movement, seizure and syncope.     Allergies  Amoxicillin;  Erythrocin; Penicillins; and Wellbutrin  Home Medications  No current outpatient prescriptions on file.  BP 108/87  Pulse 137  Temp 97.3 F (36.3 C)  Resp 22  Ht 5\' 2"  (1.575 m)  Wt 165 lb (74.844 kg)  BMI 30.18 kg/m2  SpO2 100%  LMP 12/09/2011  Physical Exam 0255: Physical examination:  Nursing notes reviewed; Vital signs and O2 SAT reviewed;  Constitutional: Well developed, Well nourished, Well hydrated, In no acute distress; Head:  Normocephalic, atraumatic; Eyes: EOMI, PERRL, No scleral icterus; ENMT: Mouth and pharynx normal, Mucous membranes moist; Neck: Supple, Full range of motion, No lymphadenopathy; Cardiovascular: Regular rate and rhythm, No murmur, rub, or gallop; Respiratory: Breath sounds clear & equal bilaterally, No rales, rhonchi, wheezes, or rub, Normal respiratory effort/excursion; Chest: Nontender, Movement normal; Abdomen: Soft, Nontender, Nondistended, Normal bowel sounds; Extremities: Pulses normal, No tenderness, No edema, No calf edema or asymmetry.; Neuro: AA&Ox3, Major CN grossly intact.  No gross focal motor or sensory deficits in extremities.; Skin: Color normal, Warm, Dry    ED Course  Procedures    MDM  MDM Reviewed: nursing note and vitals Interpretation: labs     Results for orders placed during the hospital encounter of 12/12/11  POCT PREGNANCY, URINE      Component Value Range   Preg Test, Ur NEGATIVE  NEGATIVE   POCT I-STAT, CHEM 8      Component Value Range   Sodium 145  135 - 145 (mEq/L)   Potassium 4.2  3.5 - 5.1 (mEq/L)   Chloride 111  96 - 112 (mEq/L)  BUN 12  6 - 23 (mg/dL)   Creatinine, Ser 2.13  0.50 - 1.10 (mg/dL)   Glucose, Bld 086 (*) 70 - 99 (mg/dL)   Calcium, Ion 5.78  4.69 - 1.32 (mmol/L)   TCO2 21  0 - 100 (mmol/L)   Hemoglobin 17.3 (*) 12.0 - 15.0 (g/dL)   HCT 62.9 (*) 52.8 - 46.0 (%)  URINALYSIS, ROUTINE W REFLEX MICROSCOPIC      Component Value Range   Color, Urine YELLOW  YELLOW    APPearance CLEAR  CLEAR     Specific Gravity, Urine 1.010  1.005 - 1.030    pH 7.0  5.0 - 8.0    Glucose, UA NEGATIVE  NEGATIVE (mg/dL)   Hgb urine dipstick NEGATIVE  NEGATIVE    Bilirubin Urine NEGATIVE  NEGATIVE    Ketones, ur >80 (*) NEGATIVE (mg/dL)   Protein, ur NEGATIVE  NEGATIVE (mg/dL)   Urobilinogen, UA 0.2  0.0 - 1.0 (mg/dL)   Nitrite NEGATIVE  NEGATIVE    Leukocytes, UA SMALL (*) NEGATIVE   URINE MICROSCOPIC-ADD ON      Component Value Range   Squamous Epithelial / LPF MANY (*) RARE    WBC, UA 3-6  <3 (WBC/hpf)   RBC / HPF 0-2  <3 (RBC/hpf)   Bacteria, UA FEW (*) RARE    Urine-Other TRICHOMONAS PRESENT       5:41 AM:  Pt has tol PO well while in ED.  No N/V or stooling while in ED.  Feels "better" and wants to go home.  UA with +trich and +clue cells, will tx for both with flagyl rx.  REFUSES pelvic exam.  Pt cautioned regarding the possibility of GC/chlam exposure given +trich, pt continues to refuse pelvic exam.  Pt makes her own medical decision.  Wants to go home now.  Dx testing d/w pt.  Questions answered.  Verb understanding, agreeable to d/c home with outpt f/u with OB/GYN for pelvic exam and further STD check.           Laray Anger, DO 12/12/11 1721

## 2011-12-12 NOTE — ED Notes (Signed)
Initial urine sample sent was quantity insufficient - patient informed of need for another specimen when she feels she can void

## 2011-12-12 NOTE — ED Notes (Signed)
Awaiting return of patients mother to determine if she will be staying with patient at home today.

## 2011-12-12 NOTE — ED Notes (Signed)
Water given for po fluid challenge.

## 2011-12-12 NOTE — Discharge Instructions (Signed)
RESOURCE GUIDE  Dental Problems  Patients with Medicaid: [facility]                     [facility] [address]                                           [address] Phone:  [phone]                                                  Phone:  [phone]  If unable to pay or uninsured, contact:  [facility] or [facility] to become qualified for the adult dental clinic.  Chronic Pain Problems Contact [facility]  [phone] Patients need to be referred by their primary care doctor.  Insufficient Money for Medicine Contact [employer]:  call "211" or [facility] 271-5999.  No Primary Care Doctor Call [facility]  832-8000 Other agencies that provide inexpensive medical care    [facility]  [phone]    [facility]  [phone]    [facility]  [phone]    Women's Clinic  [phone]    [facility]  [phone]    [facility]  [phone]  Psychological Services [facility]  [phone] [facility]  [phone] [facility]   800 853-5163 (emergency services 641-4993)  Substance Abuse Resources [facility]  336-882-2125 [facility] 336-784-9470 [facility] 336-285-9073 [facility] 336-845-3988 Residential & Outpatient Substance Abuse Program  800-659-3381  Abuse/Neglect [place] Child Abuse Hotline (336) 641-3795 [place] Child Abuse Hotline 800-378-5315 (After Hours)  Emergency Shelter [facility] (336) 271-5985  Maternity Homes [facility] (336) 275-9566 [facility] (704) 372-4663  MRSA Hotline #:   832-7006    [place] Resources  [facility] [place]     [employer]                          [facility] [address] [place]                       [address]      [address]  [place]                                                [place]                            [place] Phone:  [phone]                                   Phone:  [phone]                 Phone:  [phone]  [facility] Phone:  [phone]    [facility] Child Abuse Hotline 217-075-0700 (715)281-3038 (After Hours)   Take the prescriptions as directed.  Increase your fluid intake (ie:  Gatoraide) for the next few days, as discussed.  Eat a bland diet and advance to your regular diet slowly as you can tolerate it.   Avoid full strength juices, as well as milk and milk products until your diarrhea has resolved.   Call your regular medical doctor and your OB/GYN doctor today to schedule a follow up appointment this week.  Return to the Emergency Department immediately if not improving (or even worsening) despite taking the medicines as prescribed, any black or bloody stool or vomit, if you develop a fever, or for any other concerns.

## 2011-12-12 NOTE — ED Notes (Signed)
Pt c/o n/v/d/ since last night around 8:30

## 2012-03-10 ENCOUNTER — Encounter (HOSPITAL_COMMUNITY): Payer: Self-pay | Admitting: Emergency Medicine

## 2012-03-10 ENCOUNTER — Emergency Department (HOSPITAL_COMMUNITY)
Admission: EM | Admit: 2012-03-10 | Discharge: 2012-03-10 | Disposition: A | Discharge status: Home / Self Care | Payer: Medicaid Other | Attending: Emergency Medicine | Admitting: Emergency Medicine

## 2012-03-10 ENCOUNTER — Emergency Department (HOSPITAL_COMMUNITY): Payer: Medicaid Other

## 2012-03-10 DIAGNOSIS — M79603 Pain in arm, unspecified: Secondary | ICD-10-CM

## 2012-03-10 DIAGNOSIS — F17200 Nicotine dependence, unspecified, uncomplicated: Secondary | ICD-10-CM | POA: Insufficient documentation

## 2012-03-10 DIAGNOSIS — M79609 Pain in unspecified limb: Secondary | ICD-10-CM | POA: Insufficient documentation

## 2012-03-10 HISTORY — DX: Major depressive disorder, single episode, unspecified: F32.9

## 2012-03-10 HISTORY — DX: Depression, unspecified: F32.A

## 2012-03-10 HISTORY — DX: Anxiety disorder, unspecified: F41.9

## 2012-03-10 NOTE — ED Notes (Signed)
Patient c/o left arm pain after falling. Per patient "feels like my birth control the nexplanon exploded in my arm." Per patient arm swelling, tender, and bruised where implant is.

## 2012-03-10 NOTE — ED Provider Notes (Signed)
Medical screening examination/treatment/procedure(s) were performed by non-physician practitioner and as supervising physician I was immediately available for consultation/collaboration. Contessa Preuss, MD, FACEP   Evangaline Jou L Felisia Balcom, MD 03/10/12 1619 

## 2012-03-10 NOTE — Discharge Instructions (Signed)
Your xrays are negative for acute problem. Please return if any changes or problem.

## 2012-03-10 NOTE — ED Provider Notes (Signed)
History     CSN: [id number]  Arrival date & time 03/10/12  1329   First MD Initiated Contact with Patient 03/10/12 1402      Chief Complaint  Patient presents with  . Fall  . Arm Pain    (Consider location/radiation/quality/duration/timing/severity/associated sxs/prior treatment) HPI Comments: Patient states that on yesterday she fell out of a chair and injured the left arm. The patient has a birth control device (nexplanon) in the left arm. Since the time of the fall she has been experiencing a burning sensation and a sensation of swelling at the area near the birth control device. She requests to have this evaluated. She also requests to be checked for possible fractures or dislocations following the fall. The patient denies any problem using the arm. She's not had any swelling or pain in any other sites.  Patient is a 21 y.o. female presenting with fall and arm pain. The history is provided by the patient.  Fall Pertinent negatives include no abdominal pain and no hematuria.  Arm Pain Pertinent negatives include no abdominal pain, arthralgias, chest pain, coughing or neck pain.    Past Medical History  Diagnosis Date  . Anxiety   . Depression     Past Surgical History  Procedure Date  . Tonsillectomy   . Cesarean section x2  . Adenoidectomy     History reviewed. No pertinent family history.  History  Substance Use Topics  . Smoking status: Current Everyday Smoker -- 0.7 packs/day for 5 years    Types: Cigarettes  . Smokeless tobacco: Never Used  . Alcohol Use: No    OB History    Grav Para Term Preterm Abortions TAB SAB Ect Mult Living   2 2 2       2       Review of Systems  Constitutional: Negative for activity change.       All ROS Neg except as noted in HPI  HENT: Negative for nosebleeds and neck pain.   Eyes: Negative for photophobia and discharge.  Respiratory: Negative for cough, shortness of breath and wheezing.   Cardiovascular: Negative for  chest pain and palpitations.  Gastrointestinal: Negative for abdominal pain and blood in stool.  Genitourinary: Negative for dysuria, frequency and hematuria.  Musculoskeletal: Negative for back pain and arthralgias.  Skin: Negative.   Neurological: Negative for dizziness, seizures and speech difficulty.  Psychiatric/Behavioral: Negative for hallucinations and confusion. The patient is nervous/anxious.     Allergies  Amoxicillin; Apple; Erythrocin; Penicillins; and Wellbutrin  Home Medications   Current Outpatient Rx  Name Route Sig Dispense Refill  . ETONOGESTREL 68 MG [place] IMPL Subcutaneous Inject 1 each into the skin once.      BP 115/72  Pulse 86  Temp 98.8 F (37.1 C) (Oral)  Resp 17  Ht 5\' 2"  (1.575 m)  SpO2 99%  LMP 01/25/2012  Physical Exam  Nursing note and vitals reviewed. Constitutional: She is oriented to person, place, and time. She appears well-developed and well-nourished.  Non-toxic appearance.  HENT:  Head: Normocephalic.  Right Ear: Tympanic membrane and external ear normal.  Left Ear: Tympanic membrane and external ear normal.  Eyes: EOM and lids are normal. Pupils are equal, round, and reactive to light.  Neck: Normal range of motion. Neck supple. Carotid bruit is not present.  Cardiovascular: Normal rate, regular rhythm, normal heart sounds, intact distal pulses and normal pulses.   Pulmonary/Chest: Breath sounds normal. No respiratory distress.  Abdominal: Soft. Bowel sounds are normal.  There is no tenderness. There is no guarding.  Musculoskeletal: Normal range of motion.       The patient has full range of motion of the left shoulder. Is full range of motion of the left elbow. There is a palpable birth control device in the lower triceps area on the left. There is no hematoma present. The area is not hot. There is full range of motion of the left wrist and fingers. Good capillary refill.  Lymphadenopathy:       Head (right side): No submandibular  adenopathy present.       Head (left side): No submandibular adenopathy present.    She has no cervical adenopathy.  Neurological: She is alert and oriented to person, place, and time. She has normal strength. No cranial nerve deficit or sensory deficit.  Skin: Skin is warm and dry.  Psychiatric: She has a normal mood and affect. Her speech is normal.    ED Course  Procedures (including critical care time)  Labs Reviewed - No data to display Dg Humerus Left  03/10/2012  *RADIOLOGY REPORT*  Clinical Data: Fall.  LEFT HUMERUS - 2+ VIEW  Comparison: None  Findings: There is no evidence of fracture or dislocation.  There is no evidence of arthropathy or other focal bone abnormality. Radio- opaque foreign body within the soft tissues of the left upper extremity compatible with birth control device appears intact without evidence for fracture.  IMPRESSION: Negative exam.  Original Report Authenticated By: Rosealee Albee, M.D.   Pulse oximetry 99% on room air. Within normal limits by my interpretation.  1. Arm pain       MDM  I have reviewed nursing notes, vital signs, and all appropriate lab and imaging results for this patient.  The x-ray of the left humerus area reveals the birth control device intact. There is no fracture or dislocation appreciated. Patient reassured of the x-ray findings. She is invited to return to the emergency department if any changes, problems, or concerns.      Kathie Dike, [place] 03/10/12 1440

## 2012-06-08 ENCOUNTER — Ambulatory Visit
Admission: RE | Admit: 2012-06-08 | Discharge: 2012-06-08 | Disposition: A | Discharge status: Home / Self Care | Payer: Medicaid Other | Source: Ambulatory Visit | Attending: Family Medicine | Admitting: Family Medicine

## 2012-06-08 ENCOUNTER — Other Ambulatory Visit: Payer: Self-pay | Admitting: Family Medicine

## 2012-06-08 DIAGNOSIS — R059 Cough, unspecified: Secondary | ICD-10-CM

## 2012-06-08 DIAGNOSIS — R05 Cough: Secondary | ICD-10-CM

## 2012-06-08 DIAGNOSIS — R079 Chest pain, unspecified: Secondary | ICD-10-CM

## 2012-06-25 ENCOUNTER — Ambulatory Visit (HOSPITAL_COMMUNITY): Payer: Medicaid Other | Attending: Cardiology

## 2013-04-29 ENCOUNTER — Emergency Department (HOSPITAL_COMMUNITY)
Admission: EM | Admit: 2013-04-29 | Discharge: 2013-04-29 | Disposition: A | Discharge status: Home / Self Care | Payer: Medicaid Other | Attending: Emergency Medicine | Admitting: Emergency Medicine

## 2013-04-29 ENCOUNTER — Encounter (HOSPITAL_COMMUNITY): Payer: Self-pay | Admitting: Emergency Medicine

## 2013-04-29 DIAGNOSIS — O219 Vomiting of pregnancy, unspecified: Secondary | ICD-10-CM

## 2013-04-29 DIAGNOSIS — O2390 Unspecified genitourinary tract infection in pregnancy, unspecified trimester: Secondary | ICD-10-CM | POA: Insufficient documentation

## 2013-04-29 DIAGNOSIS — B9689 Other specified bacterial agents as the cause of diseases classified elsewhere: Secondary | ICD-10-CM | POA: Insufficient documentation

## 2013-04-29 DIAGNOSIS — O210 Mild hyperemesis gravidarum: Secondary | ICD-10-CM | POA: Insufficient documentation

## 2013-04-29 DIAGNOSIS — F17200 Nicotine dependence, unspecified, uncomplicated: Secondary | ICD-10-CM | POA: Insufficient documentation

## 2013-04-29 DIAGNOSIS — R109 Unspecified abdominal pain: Secondary | ICD-10-CM | POA: Insufficient documentation

## 2013-04-29 DIAGNOSIS — Z880 Allergy status to penicillin: Secondary | ICD-10-CM | POA: Insufficient documentation

## 2013-04-29 DIAGNOSIS — N390 Urinary tract infection, site not specified: Secondary | ICD-10-CM | POA: Insufficient documentation

## 2013-04-29 DIAGNOSIS — Z8659 Personal history of other mental and behavioral disorders: Secondary | ICD-10-CM | POA: Insufficient documentation

## 2013-04-29 DIAGNOSIS — A499 Bacterial infection, unspecified: Secondary | ICD-10-CM | POA: Insufficient documentation

## 2013-04-29 LAB — BASIC METABOLIC PANEL
BUN: 5 mg/dL — ABNORMAL LOW (ref 6–23)
CO2: 20 mEq/L (ref 19–32)
Chloride: 106 mEq/L (ref 96–112)
Glucose, Bld: 98 mg/dL (ref 70–99)
Potassium: 3.7 mEq/L (ref 3.5–5.1)

## 2013-04-29 LAB — URINALYSIS, ROUTINE W REFLEX MICROSCOPIC
Bilirubin Urine: NEGATIVE
Glucose, UA: NEGATIVE mg/dL
Ketones, ur: 15 mg/dL — AB
Protein, ur: NEGATIVE mg/dL
pH: 8.5 — ABNORMAL HIGH (ref 5.0–8.0)

## 2013-04-29 MED ORDER — ONDANSETRON 4 MG PO TBDP: ORAL_TABLET | ORAL | Status: DC

## 2013-04-29 MED ORDER — SODIUM CHLORIDE 0.9 % IV BOLUS (SEPSIS)
1000.0000 mL | Freq: Once | INTRAVENOUS | Status: AC
  Administered 2013-04-29: 1000 mL via INTRAVENOUS

## 2013-04-29 MED ORDER — ONDANSETRON HCL 4 MG/2ML IJ SOLN
4.0000 mg | Freq: Once | INTRAMUSCULAR | Status: AC
  Administered 2013-04-29: 4 mg via INTRAVENOUS
  Filled 2013-04-29: qty 2

## 2013-04-29 NOTE — ED Provider Notes (Signed)
CSN: [id number]     Arrival date & time 04/29/13  1359 History  This chart was scribed for Lynn Skeens, MD by Leone Payor, ED Scribe. This patient was seen in room APA12/APA12 and the patient's care was started 2:26 PM.    Chief Complaint  Patient presents with  . Abdominal Cramping    The history is provided by the patient. No language interpreter was used.    HPI Comments: Lynn Nolan is a 22 y.o. female who presents to the Emergency Department complaining of constant, unchanged abdominal pain described as cramping that started about 1 week ago. She states the cramping is similar to menstrual cramps. Pt reports having associated nausea and vomiting that started about 2 weeks ago. Pt states the cramping is aggravated by sitting or standing and is alleviated by laying down. Pt is about [redacted] weeks pregnant but she has not had an ultrasound. She reports having a history of UTI. She has history of 2 cesarean sections for her previous pregnancies. She denies HA, vision changes, neck pain, cough, vaginal bleeding.    Past Medical History  Diagnosis Date  . Anxiety   . Depression    Past Surgical History  Procedure Laterality Date  . Tonsillectomy    . Cesarean section  x2  . Adenoidectomy     No family history on file. History  Substance Use Topics  . Smoking status: Current Every Day Smoker -- 0.75 packs/day for 5 years    Types: Cigarettes  . Smokeless tobacco: Never Used  . Alcohol Use: No   OB History   Grav Para Term Preterm Abortions TAB SAB Ect Mult Living   3 2 2       2      Review of Systems  HENT: Negative for neck pain.   Eyes: Negative for visual disturbance.  Respiratory: Negative for cough.   Gastrointestinal: Positive for nausea, vomiting and abdominal pain (cramping).  Genitourinary: Negative for vaginal bleeding.  Neurological: Negative for headaches.  All other systems reviewed and are negative.    Allergies  Amoxicillin; Apple; Erythrocin;  Penicillins; and Wellbutrin  Home Medications   Current Outpatient Rx  Name  Route  Sig  Dispense  Refill  . etonogestrel (NEXPLANON) 68 MG IMPL implant   Subcutaneous   Inject 1 each into the skin once.          Pulse 72  Temp(Src) 97.2 F (36.2 C) (Oral)  Resp 20  Ht 5\' 3"  (1.6 m)  Wt 140 lb (63.504 kg)  BMI 24.81 kg/m2  SpO2 100%  LMP 03/08/2012 Physical Exam  Nursing note and vitals reviewed. Constitutional: She is oriented to person, place, and time. She appears well-developed and well-nourished.  HENT:  Head: Atraumatic. Macrocephalic.  Mouth/Throat: Mucous membranes are dry.  Eyes: Conjunctivae and EOM are normal. Pupils are equal, round, and reactive to light. No scleral icterus.  Neck: Normal range of motion. Neck supple.  Cardiovascular: Normal rate, regular rhythm and normal heart sounds.   No murmur heard. Good pulses.   Pulmonary/Chest: Effort normal and breath sounds normal.  Abdominal: Soft. Bowel sounds are normal. There is no guarding.  No focal tenderness. Uncomfortable in the epigastric area.   Musculoskeletal: Normal range of motion. She exhibits no edema.  Neurological: She is alert and oriented to person, place, and time.  Skin: Skin is warm and dry.  Psychiatric: She has a normal mood and affect.    ED Course   Procedures (including critical  care time) EMERGENCY DEPARTMENT [place] PREGNANCY "Study: Limited Ultrasound of the Pelvis for Pregnancy"  INDICATIONS:abd cramping, pregnancy Multiple views of the uterus and pelvic cavity were obtained in real-time with a multi-frequency probe.  APPROACH:transabdomenal  PERFORMED ZO:XWRUEA  IMAGES ARCHIVED?: yes  LIMITATIONS: none (early preg)  PREGNANCY FREE FLUID: no  PREGNANCY FINDINGS: fetal pole, yolk sac  INTERPRETATION: 6 wk 6 days intrauterine preg   FETAL HEART RATE: 171    DIAGNOSTIC STUDIES: Oxygen Saturation is 100% on RA, normal by my interpretation.    COORDINATION OF  CARE: 2:36 PM Discussed treatment plan with pt at bedside and pt agreed to plan.   Labs Reviewed  URINALYSIS, ROUTINE W REFLEX MICROSCOPIC - Abnormal; Notable for the following:    pH 8.5 (*)    Ketones, ur 15 (*)    All other components within normal limits  PREGNANCY, URINE - Abnormal; Notable for the following:    Preg Test, Ur POSITIVE (*)    All other components within normal limits  BASIC METABOLIC PANEL - Abnormal; Notable for the following:    BUN 5 (*)    All other components within normal limits   No results found. No diagnosis found.  MDM  I personally performed the services described in this documentation, which was scribed in my presence. The recorded information has been reviewed and is accurate.  No abd pain on recheck.  Early pregnancy. Fup with ob and reasons to return discussed. No suspicion for ectopic at this time. No vaginal bleeding. Well appearing. Fluids given.  DC   Lynn Skeens, MD 05/01/13 1141

## 2013-04-29 NOTE — ED Notes (Addendum)
Pt presents with c/o hyperemesis with pregnancy of approximately 10 weeks per pt. Pt also c/o abdominal cramping off and on for 4-5 days. Pt denies vaginal discharge/bleeding at this time. Pt reports is a G3P2 at this time. Last live birth was 2 years prior. No high risk pregnancy reported. Pt denies diarrhea, fever and others in the home with sickness. Pt denies prenatal care at this time. [facility] appointment is scheduled for 05/06/2013.  Pt awoke this morning with emesis and increased cramping. Will continue to monitor.

## 2013-04-29 NOTE — ED Notes (Signed)
States that she has been having abdominal cramping for about 1 week.  States she is having nausea and vomiting that started 2 weeks ago.

## 2013-07-03 ENCOUNTER — Other Ambulatory Visit: Payer: Self-pay | Admitting: Family Medicine

## 2013-07-03 DIAGNOSIS — O30012 Twin pregnancy, monochorionic/monoamniotic, second trimester: Secondary | ICD-10-CM

## 2013-07-16 ENCOUNTER — Encounter (HOSPITAL_COMMUNITY): Payer: Self-pay

## 2013-07-16 ENCOUNTER — Ambulatory Visit (HOSPITAL_COMMUNITY)
Admission: RE | Admit: 2013-07-16 | Discharge: 2013-07-16 | Disposition: A | Discharge status: Home / Self Care | Payer: Medicaid Other | Source: Ambulatory Visit | Attending: Family Medicine | Admitting: Family Medicine

## 2013-07-16 ENCOUNTER — Ambulatory Visit (HOSPITAL_COMMUNITY)
Admission: RE | Admit: 2013-07-16 | Discharge: 2013-07-16 | Disposition: A | Discharge status: Home / Self Care | Payer: Medicaid Other | Source: Ambulatory Visit | Attending: Obstetrics and Gynecology | Admitting: Obstetrics and Gynecology

## 2013-07-16 DIAGNOSIS — O352XX0 Maternal care for (suspected) hereditary disease in fetus, not applicable or unspecified: Secondary | ICD-10-CM | POA: Insufficient documentation

## 2013-07-16 DIAGNOSIS — Z1389 Encounter for screening for other disorder: Secondary | ICD-10-CM | POA: Insufficient documentation

## 2013-07-16 DIAGNOSIS — O30012 Twin pregnancy, monochorionic/monoamniotic, second trimester: Secondary | ICD-10-CM

## 2013-07-16 DIAGNOSIS — O34219 Maternal care for unspecified type scar from previous cesarean delivery: Secondary | ICD-10-CM | POA: Insufficient documentation

## 2013-07-16 DIAGNOSIS — Z363 Encounter for antenatal screening for malformations: Secondary | ICD-10-CM | POA: Insufficient documentation

## 2013-07-16 DIAGNOSIS — O358XX0 Maternal care for other (suspected) fetal abnormality and damage, not applicable or unspecified: Secondary | ICD-10-CM | POA: Insufficient documentation

## 2013-07-16 DIAGNOSIS — O337XX0 Maternal care for disproportion due to other fetal deformities, not applicable or unspecified: Secondary | ICD-10-CM | POA: Insufficient documentation

## 2013-07-16 DIAGNOSIS — O30009 Twin pregnancy, unspecified number of placenta and unspecified number of amniotic sacs, unspecified trimester: Secondary | ICD-10-CM | POA: Insufficient documentation

## 2013-07-16 DIAGNOSIS — O360990 Maternal care for other rhesus isoimmunization, unspecified trimester, not applicable or unspecified: Secondary | ICD-10-CM | POA: Insufficient documentation

## 2013-07-16 NOTE — Consult Note (Signed)
MFM Consultation, Staff Note regarding Monoamniotic Twin Gestation:  By way of consultation, I briefly explained the biology of twinning, both monozygotic and dizygotic.  I described the sequence of development of the chorionic and amniotic membranes, and the clinical significance of chorionicity.   I spoke to [name]  about the risks and management of monoamniotic twin pregnancy.  The most paramount risk is that of fetal cord entanglement.  Today's ultrasound already showed some entanglement.  I explained that fetal movement can cause significant traction on an entangled cord, which can lead to a cutoff of oxygen to one or both fetuses.  For this reason, very intensive antenatal surveillance is indicated in monoamniotic twins.  However, the desire to begin this surveillance early must be balanced against the significant risks of extreme prematurity, which do not only include neonatal death but also include the risk of one or more serious long-term handicaps in survivors.  Therefore, it would be my recommendation to have this couple meet with a neonatologist to review the available data before the age of viability is reached.  Most patients elect to initiate surveillance between 24 and 26 weeks' gestation.  Once surveillance is initiated, there is no universal agreement as to the appropriate frequency.  While most of the data are anecdotal, fetal death in utero has occurred in pregnancies that have undergone fetal heart rate testing 2-3 times per week.  Therefore, I would recommend a minimum of thrice daily electronic monitoring to look for evidence of cord compression.  An alternative would also be to have continuous external fetal heart rate monitoring at 24 weeks which is our [facility] practice recommendation.  Sonographic surveillance, including arterial and venous Doppler studies and fetal interval growth, should be planned at least monthly intervals.  I would  either reserve steroid administration for a time when fetal heart rate abnormalities are noted on the monitor or empirically administer steroids at the initiation of intensive fetal surveillance at 24 weeks and, once again, around 30 weeks' gestation.  Further debate surrounds the issue of delivery timing.  While several authors have suggested that fetal death is uncommon after 32 weeks' gestation and that, therefore, pregnancies can be allowed to proceed once they reach that gestational age, there is compelling anecdotal evidence that fetal death can and does occur after this gestational age.  Therefore, I would recommend elective cesarean delivery at approximately 32 weeks' gestation at a level III center.  In addition, because of the monochorionicity the twins are expected to share some placental vessels.  Although it is virtually never seen in the setting of monoamnionicity, this poses at least a theoretical risk for twin-twin transfusion syndrome.  Since differential amniotic fluid volumes cannot be used to establish this diagnosis in this setting, the planned serial ultrasounds in monochorionic twin pregnancies should be supplemented by umbilical arterial and venous Doppler studies, as noted above.  Monochorionic twins are at increased risk of congenital malformations.  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  Nonetheless, the residual possibility of a congenital defect exists.  Hence, I offered genetic counseling which she accepted; see separate report.  In addition to the above considerations, one needs to think of a possible course of action if one of the twin suffers an intrauterine demise.  This would place the remaining co-twin at significant risk of embolization, which  can have profound neurological and other consequences.  In such an  event, depending upon gestational age, the patient would either be a candidate for umbilical cord ligation or elective delivery even earlier than originally planned.  The patient and her partner asked multiple questions that indicated their fairly sophisticated understanding of the situation.  They were rescheduled to return to our clinic for followup ultrasound in 2 weeks (at 20 weeks) and 4 weeks with neonatology consultation at approximately 22 weeks' gestation.  In light of the intensity of antenatal surveillance, co-management of this pregnancy is highly desirable.  Our center would, naturally, be happy to provide the ultrasound diagnosis (with antenatal testing on days of ultrasound), as well as delivery services, if this is desired.  As always, thank you very much for referring your high-risk pregnant patients to our center for diagnosis and counseling.  Time Spent:  I spent in excess of 40 minutes in consultation with this patient to review records, evaluate her case, and provide her with an adequate discussion and education.  More than 50% of this time was spent in direct face-to-face counseling. It was a pleasure seeing your patient in the office today.  Thank you for consultation. Please do not hesitate to contact our service for any further questions.   Thank you,  Lynn Nolan, Lynn Sjogren, MD, MS, FACOG Assistant Professor Section of Maternal-Fetal Medicine [facility]

## 2013-07-19 NOTE — Progress Notes (Signed)
Genetic Counseling  High-Nolan Gestation Note  Appointment Date:  07/16/2013 Referred By: Lynn Done, MD Date of Birth:  February 29, 1991    Pregnancy History: [id number] Estimated Date of Delivery: 12/16/13 Estimated Gestational Age: [redacted]w[redacted]d Attending: Damaris Hippo, MD   [name]  was seen for genetic counseling because of abnormal ultrasound finding and previous son with a neural tube defect. She was also seen for ultrasound and Maternal Fetal Medicine consultation given that the current pregnancy is monochorionic/monoamniotic twin gestation.     We reviewed that ultrasound performed today visualized a choroid plexus cyst in twin B. Remaining visualized fetal anatomy appeared normal. Complete ultrasound results reported separately.  We discussed that the second trimester genetic sonogram is targeted at identifying features associated with aneuploidy.  It has evolved as a screening tool used to provide an individualized Nolan assessment for Down syndrome and other trisomies.  The ability of sonography to aid in the detection of aneuploidies relies on identification of both major structural anomalies and "soft markers."  The patient was counseled that the latter term refers to findings that are often normal variants and do not cause any significant medical problems.  Nonetheless, these markers have a known association with aneuploidy.    [name] was counseled that the choroid plexus is an area in the brain where cerebral spinal fluid, the fluid that bathes the brain and spinal cord, is made.  Cysts, or fluid filled sacs, are sometimes found in the choroid plexus of babies both before and after they are born.  We discussed that approximately 1% of pregnancies evaluated by ultrasound will show choroid plexus cyst (CPCs).  Literature suggests that CPCs are an ultrasound finding in approximately 30-50% of fetuses with trisomy 18, but are an isolated finding in less than 10% of  fetuses with trisomy 21.  [name] was counseled that when a patient has other Nolan factors for fetal trisomy 21 (abnormal First trimester or quad screening, advanced maternal age, or another ultrasound finding), CPCs are associated with an increased Nolan (LR of 9) for trisomy 21.  Newer literature suggests that in the absence of other Nolan factors, CPCs are likely a normal variation of development or a benign finding.  CPCs are not associated with an increased Nolan for fetal Down syndrome.   We reviewed chromosomes, nondisjunction, and the common features and poor prognosis of trisomy 21.  Considering her maternal age of 22 y.o. and her otherwise normal fetal anatomy ultrasound, [name] for fetal trisomy 21 is expected to be similar to her age related Nolan. We discussed that at most, the ultrasound finding would be associated with a Nolan for fetal trisomy 21 of approximately 1 in 544.   However, additional testing options for detection of fetal trisomy 21 were discussed.  We reviewed the availability of noninvasive prenatal screening (NIPS)/cell free DNA testing including the conditions for which it screens, detection rates and false positive rates.  In addition, we discussed the option of diagnostic testing via amniocentesis.  We reviewed the approximate 1 in 300-500 Nolan for complications, including spontaneous pregnancy loss. We reviewed that given the patient's age and ultrasound findings, the Nolan for fetal trisomy 21 is expected to be less than the associated Nolan of complications with amniocentesis. After consideration of all the options, she declined NIPS and amniocentesis.   Both family histories were reviewed and found to be contributory for the patient's first son, with a different partner from the current pregnancy, with a  spinal dermal tract. Lynn Nolan was seen for genetic counseling on 12/30/2010 regarding this family history. At that appointment, the patient reported that her  son was noted to have cyst near his spine on the lower part of his back. He reportedly has mongolian blue spots and a birthmark on his back as well. The cyst was surgically removed at age 1 months at [facility]. He was followed by [facility] but reportedly does not require regular follow-up at this time. We reviewed that dermal sinus tracts (DSTs) are reported to be an uncommon form of spinal dysraphism. This can be a feature of spina bifida occulta. Open neural tube defects (ONTDs), such as spina bifida, when isolated and not occurring as a feature of a genetic syndrome, are thought to follow multifactorial inheritance (both genetic and environmental factors play a role in manifestation). Spina bifida occulta, or closed spina bifida, often describes an absence of one or two vertebral arches, without a visible lesion, which may be discovered incidentally. Some forms of spina bifida occulta can cause symptoms, such as the presence of a tethered cord or spinal dysraphism. Limited information exists regarding the prevalence and underlying cause of spina bifida occulta. Studies suggest that in cases of asymptomatic spina bifida occulta, recurrence Nolan to relatives is not likely increased. In the case of symptomatic spina bifida occulta, recurrence Nolan for spina bifida may be similar to that of open neural tube defects. In the case of multifactorial inheritance, recurrence Nolan for open neural tube defects for the offspring of an affected individual may be up to approximately 1-2%. We are available to review Lynn Nolan's son's medical records, if desired. Additional information regarding Lynn Nolan's son's condition may alter recurrence Nolan assessment. We discussed that targeted ultrasound can assess for open neural tube defects. However, prenatal targeted ultrasound would not assess for spina bifida occulta, and ultrasound does not detect all birth defects prenatally.  We discussed that it would be helpful for the patient to talk with her pediatrician and with her son's neurosurgeon regarding possible screening or evaluation that may be recommended for the patient's current pregnancy after birth.   The family histories were otherwise unremarkable for birth defects, intellectual disability, recurrent pregnancy loss, and known genetic conditions. Without further information regarding the provided family history, an accurate genetic Nolan cannot be calculated. Further genetic counseling is warranted if more information is obtained.  Lynn Nolan denied exposure to environmental toxins or chemical agents. She denied the use of alcohol, tobacco or street drugs. She denied significant viral illnesses during the course of her pregnancy. Her medical and surgical histories were noncontributory. Please see separate MFM consult note for additional discussion regarding pregnancy management for monochorionic/monoamniotic twins.   I counseled Ms. Lynn D Smick regarding the above risks and available options.  The approximate face-to-face time with the genetic counselor was 25 minutes.  Quinn Plowman, MS Certified Genetic Counselor 07/19/2013

## 2013-07-25 ENCOUNTER — Other Ambulatory Visit (HOSPITAL_COMMUNITY): Payer: Self-pay | Admitting: Obstetrics and Gynecology

## 2013-07-25 DIAGNOSIS — O30009 Twin pregnancy, unspecified number of placenta and unspecified number of amniotic sacs, unspecified trimester: Secondary | ICD-10-CM

## 2013-07-30 ENCOUNTER — Ambulatory Visit (HOSPITAL_COMMUNITY)
Admission: RE | Admit: 2013-07-30 | Discharge: 2013-07-30 | Disposition: A | Discharge status: Home / Self Care | Payer: Medicaid Other | Source: Ambulatory Visit | Attending: Obstetrics and Gynecology | Admitting: Obstetrics and Gynecology

## 2013-07-30 DIAGNOSIS — O3500X0 Maternal care for (suspected) central nervous system malformation or damage in fetus, unspecified, not applicable or unspecified: Secondary | ICD-10-CM | POA: Insufficient documentation

## 2013-07-30 DIAGNOSIS — O34219 Maternal care for unspecified type scar from previous cesarean delivery: Secondary | ICD-10-CM | POA: Insufficient documentation

## 2013-07-30 DIAGNOSIS — O30009 Twin pregnancy, unspecified number of placenta and unspecified number of amniotic sacs, unspecified trimester: Secondary | ICD-10-CM

## 2013-07-30 DIAGNOSIS — O352XX0 Maternal care for (suspected) hereditary disease in fetus, not applicable or unspecified: Secondary | ICD-10-CM | POA: Insufficient documentation

## 2013-07-30 DIAGNOSIS — O350XX0 Maternal care for (suspected) central nervous system malformation in fetus, not applicable or unspecified: Secondary | ICD-10-CM | POA: Insufficient documentation

## 2013-07-30 DIAGNOSIS — O30019 Twin pregnancy, monochorionic/monoamniotic, unspecified trimester: Secondary | ICD-10-CM | POA: Insufficient documentation

## 2013-07-30 DIAGNOSIS — O360990 Maternal care for other rhesus isoimmunization, unspecified trimester, not applicable or unspecified: Secondary | ICD-10-CM | POA: Insufficient documentation

## 2013-07-30 DIAGNOSIS — O337XX0 Maternal care for disproportion due to other fetal deformities, not applicable or unspecified: Secondary | ICD-10-CM | POA: Insufficient documentation

## 2013-08-06 ENCOUNTER — Inpatient Hospital Stay (HOSPITAL_COMMUNITY): Payer: Medicaid Other

## 2013-08-06 ENCOUNTER — Inpatient Hospital Stay (HOSPITAL_COMMUNITY)
Admission: AD | Admit: 2013-08-06 | Discharge: 2013-08-07 | Disposition: A | Discharge status: Home / Self Care | Payer: Medicaid Other | Source: Ambulatory Visit | Attending: Obstetrics and Gynecology | Admitting: Obstetrics and Gynecology

## 2013-08-06 ENCOUNTER — Encounter (HOSPITAL_COMMUNITY): Payer: Self-pay | Admitting: *Deleted

## 2013-08-06 DIAGNOSIS — O99330 Smoking (tobacco) complicating pregnancy, unspecified trimester: Secondary | ICD-10-CM | POA: Insufficient documentation

## 2013-08-06 DIAGNOSIS — O30019 Twin pregnancy, monochorionic/monoamniotic, unspecified trimester: Secondary | ICD-10-CM | POA: Insufficient documentation

## 2013-08-06 DIAGNOSIS — O99891 Other specified diseases and conditions complicating pregnancy: Secondary | ICD-10-CM | POA: Insufficient documentation

## 2013-08-06 DIAGNOSIS — R109 Unspecified abdominal pain: Secondary | ICD-10-CM | POA: Insufficient documentation

## 2013-08-06 DIAGNOSIS — N949 Unspecified condition associated with female genital organs and menstrual cycle: Secondary | ICD-10-CM | POA: Insufficient documentation

## 2013-08-06 DIAGNOSIS — M549 Dorsalgia, unspecified: Secondary | ICD-10-CM | POA: Insufficient documentation

## 2013-08-06 DIAGNOSIS — O30009 Twin pregnancy, unspecified number of placenta and unspecified number of amniotic sacs, unspecified trimester: Secondary | ICD-10-CM | POA: Insufficient documentation

## 2013-08-06 LAB — URINALYSIS, ROUTINE W REFLEX MICROSCOPIC
Bilirubin Urine: NEGATIVE
Glucose, UA: NEGATIVE mg/dL
Hgb urine dipstick: NEGATIVE
Nitrite: NEGATIVE
Specific Gravity, Urine: <1.005 — ABNORMAL LOW (ref 1.005–1.030)
pH: 7 (ref 5.0–8.0)

## 2013-08-06 LAB — WET PREP, GENITAL
Clue Cells Wet Prep HPF POC: NONE SEEN
Trich, Wet Prep: NONE SEEN

## 2013-08-06 NOTE — MAU Provider Note (Signed)
  History     CSN: [id number]  Arrival date and time: 08/06/13 2018   None     Chief Complaint  Patient presents with  . Abdominal Pain   Abdominal Pain    Lynn Nolan is a 22 y.o. G3P2002 at [redacted]w[redacted]d who presents today with back pain, and pressure and orange vaginal discharge for about 3-4 hours. She is unsure if she has had bleeding. She states that she has been feeling the babies move. She has mono/mono twins. She denies any LOF.   Past Medical History  Diagnosis Date  . Anxiety   . Depression     Past Surgical History  Procedure Laterality Date  . Tonsillectomy    . Cesarean section  x2  . Adenoidectomy      History reviewed. No pertinent family history.  History  Substance Use Topics  . Smoking status: Current Every Day Smoker -- 0.75 packs/day for 5 years    Types: Cigarettes  . Smokeless tobacco: Never Used  . Alcohol Use: No    Allergies:  Allergies  Allergen Reactions  . Amoxicillin Other (See Comments)    Childhood allergy  . Apple Nausea And Vomiting  . Erythrocin Other (See Comments)    Childhood allergy  . Penicillins Other (See Comments)    Childhood allergy   . Wellbutrin [Bupropion Hcl] Nausea Only and Other (See Comments)    Dizziness    Prescriptions prior to admission  Medication Sig Dispense Refill  . FOLIC ACID PO Take by mouth.      . Prenatal Vit-Fe Fumarate-FA (PRENATAL MULTIVITAMIN) TABS tablet Take 1 tablet by mouth daily at 12 noon.        Review of Systems  Gastrointestinal: Positive for abdominal pain.   Physical Exam   Blood pressure 114/66, pulse 105, temperature 98.6 F (37 C), temperature source Oral, resp. rate 18, height 5\' 2"  (1.575 m), weight 63.957 kg (141 lb), last menstrual period 03/11/2013, SpO2 100.00%.  Physical Exam  Nursing note and vitals reviewed. Constitutional: She is oriented to person, place, and time. She appears well-developed and well-nourished. No distress.  Neurological: She is  alert and oriented to person, place, and time.  Skin: Skin is warm and dry.  Psychiatric: She has a normal mood and affect.    MAU Course  Procedures  Medical screening for [facility] Will place her on the [facility] monitor for now  Assessment and Plan  Medical screening exam.  Tawnya Crook 08/06/2013, 8:56 PM

## 2013-08-06 NOTE — MAU Note (Signed)
Patient complains of having a lot of pressure especially while standing, in addition to sharp lower abdominal pain. Patient states when she went to urinate and wiped saw something reddish-orange.

## 2013-08-07 LAB — GC/CHLAMYDIA PROBE AMP
CT Probe RNA: NEGATIVE
GC Probe RNA: NEGATIVE

## 2013-08-07 NOTE — MAU Provider Note (Signed)
History   22 y.o. [id number] at [redacted]w[redacted]d who presents today with the principle c/o orange vaginal discharge/spotting for about 3-4 hours, as well as vaginal pressure and sharp lower abdominal pain, not a new sensation per pt.  She states that she has been feeling the babies move. Denies UCs, LOF, recent fever, resp or GI c/o's, UTI or PIH s/s. GFM.  Mono/mono twins.    Chief Complaint  Patient presents with  . Abdominal Pain   HPI  OB History   Grav Para Term Preterm Abortions TAB SAB Ect Mult Living   3 2 2       2       Past Medical History  Diagnosis Date  . Anxiety   . Depression     Past Surgical History  Procedure Laterality Date  . Tonsillectomy    . Cesarean section  x2  . Adenoidectomy      History reviewed. No pertinent family history.  History  Substance Use Topics  . Smoking status: Current Every Day Smoker -- 0.75 packs/day for 5 years    Types: Cigarettes  . Smokeless tobacco: Never Used  . Alcohol Use: No    Allergies:  Allergies  Allergen Reactions  . Amoxicillin Other (See Comments)    Childhood allergy  . Apple Nausea And Vomiting  . Erythrocin Other (See Comments)    Childhood allergy  . Penicillins Other (See Comments)    Childhood allergy   . Wellbutrin [Bupropion Hcl] Nausea Only and Other (See Comments)    Dizziness    No prescriptions prior to admission    ROS ROS: see HPI above, all other systems are negative  Physical Exam   Blood pressure 98/56, pulse 89, temperature 98.6 F (37 C), temperature source Oral, resp. rate 18, height 5\' 2"  (1.575 m), weight 141 lb (63.957 kg), last menstrual period 03/11/2013, SpO2 100.00%.  Physical Exam Chest: Clear Heart: RRR Abdomen: gravid, NT Extremities: WNL  Pelvic exam: normal external genitalia, vulva, vagina, cervix, uterus and adnexa.  Dilation: Closed Effacement (%): Thick Station: -3 Exam by:: J. Samai Corea CNM  U/S Twin A 152 bpm, Twin B 150 bpm, AFI normal, Cervical length 3.3  cm - normal and without funneling UCs: none  Results for orders placed during the hospital encounter of 08/06/13 (from the past 24 hour(s))  URINALYSIS, ROUTINE W REFLEX MICROSCOPIC     Status: Abnormal   Collection Time    08/06/13  8:25 PM      Result Value Range   Color, Urine YELLOW  YELLOW   APPearance CLEAR  CLEAR   Specific Gravity, Urine <1.005 (*) 1.005 - 1.030   pH 7.0  5.0 - 8.0   Glucose, UA NEGATIVE  NEGATIVE mg/dL   Hgb urine dipstick NEGATIVE  NEGATIVE   Bilirubin Urine NEGATIVE  NEGATIVE   Ketones, ur NEGATIVE  NEGATIVE mg/dL   Protein, ur NEGATIVE  NEGATIVE mg/dL   Urobilinogen, UA 0.2  0.0 - 1.0 mg/dL   Nitrite NEGATIVE  NEGATIVE   Leukocytes, UA NEGATIVE  NEGATIVE  WET PREP, GENITAL     Status: Abnormal   Collection Time    08/06/13  9:52 PM      Result Value Range   Yeast Wet Prep HPF POC FEW (*) NONE SEEN   Trich, Wet Prep NONE SEEN  NONE SEEN   Clue Cells Wet Prep HPF POC NONE SEEN  NONE SEEN   WBC, Wet Prep HPF POC FEW (*) NONE SEEN   ED Course  IUP at [redacted]w[redacted]d Mono/Mono twins Vaginal d/c Vaginal pressure  U/S UA Wet prep GC/CT culture pending  C/w Dr. Richardson Dopp D/c home with precautions Follow up - Scheduled appointments: [facility] 12/1 and MFM 12/4    Haroldine Laws CNM, MSN 08/07/2013 12:55 AM

## 2013-08-14 ENCOUNTER — Other Ambulatory Visit (HOSPITAL_COMMUNITY): Payer: Self-pay | Admitting: Obstetrics and Gynecology

## 2013-08-14 DIAGNOSIS — O30009 Twin pregnancy, unspecified number of placenta and unspecified number of amniotic sacs, unspecified trimester: Secondary | ICD-10-CM

## 2013-08-15 ENCOUNTER — Ambulatory Visit (HOSPITAL_COMMUNITY)
Admission: RE | Admit: 2013-08-15 | Discharge: 2013-08-15 | Disposition: A | Discharge status: Home / Self Care | Payer: Medicaid Other | Source: Ambulatory Visit | Attending: Obstetrics and Gynecology | Admitting: Obstetrics and Gynecology

## 2013-08-15 DIAGNOSIS — O352XX0 Maternal care for (suspected) hereditary disease in fetus, not applicable or unspecified: Secondary | ICD-10-CM | POA: Insufficient documentation

## 2013-08-15 DIAGNOSIS — O360990 Maternal care for other rhesus isoimmunization, unspecified trimester, not applicable or unspecified: Secondary | ICD-10-CM | POA: Insufficient documentation

## 2013-08-15 DIAGNOSIS — O34219 Maternal care for unspecified type scar from previous cesarean delivery: Secondary | ICD-10-CM | POA: Insufficient documentation

## 2013-08-15 DIAGNOSIS — O350XX0 Maternal care for (suspected) central nervous system malformation in fetus, not applicable or unspecified: Secondary | ICD-10-CM | POA: Insufficient documentation

## 2013-08-15 DIAGNOSIS — O30009 Twin pregnancy, unspecified number of placenta and unspecified number of amniotic sacs, unspecified trimester: Secondary | ICD-10-CM

## 2013-08-15 DIAGNOSIS — Z2989 Encounter for other specified prophylactic measures: Secondary | ICD-10-CM | POA: Insufficient documentation

## 2013-08-15 DIAGNOSIS — O3500X0 Maternal care for (suspected) central nervous system malformation or damage in fetus, unspecified, not applicable or unspecified: Secondary | ICD-10-CM | POA: Insufficient documentation

## 2013-08-15 DIAGNOSIS — O337XX0 Maternal care for disproportion due to other fetal deformities, not applicable or unspecified: Secondary | ICD-10-CM | POA: Insufficient documentation

## 2013-08-15 DIAGNOSIS — Z298 Encounter for other specified prophylactic measures: Secondary | ICD-10-CM | POA: Insufficient documentation

## 2013-08-15 NOTE — Progress Notes (Signed)
Lynn Nolan was seen for ultrasound appointment today.  Please see [facility] report for details.

## 2013-08-27 ENCOUNTER — Other Ambulatory Visit: Payer: Self-pay | Admitting: Family Medicine

## 2013-08-27 DIAGNOSIS — O9989 Other specified diseases and conditions complicating pregnancy, childbirth and the puerperium: Secondary | ICD-10-CM

## 2013-08-27 DIAGNOSIS — O337XX0 Maternal care for disproportion due to other fetal deformities, not applicable or unspecified: Secondary | ICD-10-CM

## 2013-08-27 DIAGNOSIS — O30009 Twin pregnancy, unspecified number of placenta and unspecified number of amniotic sacs, unspecified trimester: Secondary | ICD-10-CM

## 2013-08-27 DIAGNOSIS — O360992 Maternal care for other rhesus isoimmunization, unspecified trimester, fetus 2: Secondary | ICD-10-CM

## 2013-08-27 DIAGNOSIS — O3421 Maternal care for scar from previous cesarean delivery: Secondary | ICD-10-CM

## 2013-08-27 DIAGNOSIS — O352XX2 Maternal care for (suspected) hereditary disease in fetus, fetus 2: Secondary | ICD-10-CM

## 2013-08-27 DIAGNOSIS — O350XX2 Maternal care for (suspected) central nervous system malformation in fetus, fetus 2: Secondary | ICD-10-CM

## 2013-08-27 DIAGNOSIS — O3500X2 Maternal care for (suspected) central nervous system malformation or damage in fetus, unspecified, fetus 2: Secondary | ICD-10-CM

## 2013-08-29 ENCOUNTER — Ambulatory Visit (HOSPITAL_COMMUNITY): Admission: RE | Admit: 2013-08-29 | Payer: Medicaid Other | Source: Ambulatory Visit

## 2013-08-30 ENCOUNTER — Ambulatory Visit (HOSPITAL_COMMUNITY)
Admission: RE | Admit: 2013-08-30 | Discharge: 2013-08-30 | Disposition: A | Discharge status: Home / Self Care | Payer: Medicaid Other | Source: Ambulatory Visit | Attending: Family Medicine | Admitting: Family Medicine

## 2013-08-30 DIAGNOSIS — O3500X0 Maternal care for (suspected) central nervous system malformation or damage in fetus, unspecified, not applicable or unspecified: Secondary | ICD-10-CM | POA: Insufficient documentation

## 2013-08-30 DIAGNOSIS — O3421 Maternal care for scar from previous cesarean delivery: Secondary | ICD-10-CM

## 2013-08-30 DIAGNOSIS — O360992 Maternal care for other rhesus isoimmunization, unspecified trimester, fetus 2: Secondary | ICD-10-CM

## 2013-08-30 DIAGNOSIS — O9989 Other specified diseases and conditions complicating pregnancy, childbirth and the puerperium: Secondary | ICD-10-CM

## 2013-08-30 DIAGNOSIS — O360990 Maternal care for other rhesus isoimmunization, unspecified trimester, not applicable or unspecified: Secondary | ICD-10-CM | POA: Insufficient documentation

## 2013-08-30 DIAGNOSIS — O34219 Maternal care for unspecified type scar from previous cesarean delivery: Secondary | ICD-10-CM | POA: Insufficient documentation

## 2013-08-30 DIAGNOSIS — O30009 Twin pregnancy, unspecified number of placenta and unspecified number of amniotic sacs, unspecified trimester: Secondary | ICD-10-CM

## 2013-08-30 DIAGNOSIS — O350XX0 Maternal care for (suspected) central nervous system malformation in fetus, not applicable or unspecified: Secondary | ICD-10-CM | POA: Insufficient documentation

## 2013-08-30 DIAGNOSIS — O350XX2 Maternal care for (suspected) central nervous system malformation in fetus, fetus 2: Secondary | ICD-10-CM

## 2013-08-30 DIAGNOSIS — O352XX2 Maternal care for (suspected) hereditary disease in fetus, fetus 2: Secondary | ICD-10-CM

## 2013-08-30 DIAGNOSIS — O3500X2 Maternal care for (suspected) central nervous system malformation or damage in fetus, unspecified, fetus 2: Secondary | ICD-10-CM

## 2013-08-30 DIAGNOSIS — O337XX0 Maternal care for disproportion due to other fetal deformities, not applicable or unspecified: Secondary | ICD-10-CM | POA: Insufficient documentation

## 2013-08-30 DIAGNOSIS — O352XX0 Maternal care for (suspected) hereditary disease in fetus, not applicable or unspecified: Secondary | ICD-10-CM | POA: Insufficient documentation

## 2013-08-30 NOTE — Consult Note (Signed)
Neonatology Consult Note:  At the request of the patients MFM specialist Dr. Sherrie George I met with Lynn Nolan and FOB.  She is currently [redacted] weeks pregnant with pregnancy complicated by  Mono-mono twin gestation.  As she is nearing the 25 week mark current plan is for admission next week for fetal surveillance and monitoring until anticipated delivery at 32 weeks.   We discussed the possibility for delivery anywhere in between 25-32 weeks due to fetal distress in the setting of cord entanglement.  We discussed morbidity/mortality at these varying gestional ages.  We discussed delivery room resuscitation, including intubation and surfactant in DR.  Discussed mechanical ventilation and risk for chronic lung disease, risk for IVH with potential for motor / cognitive deficits.  Discussed temperature instability and feeding immaturity.  Discussed NG / OG feeds.   Disussed likely length of stay. She and FOB had many questions regarding timing of delivery and outcomes.  We discussed the improved mortality / morbidity outcomes at 32 weeks vs. earlier delivery however I stressed that timing of delivery is a difficult decision which must weight the dangers of cord entanglement vs. neonatal risks in the NICU.  While delivery at 32 weeks would be preferable an earlier delivery may be life saving should the infants experience distress.  She was tearful about coming in for monitoring next week however much of this seemed to relate to being gone from her other 2 children over the holidays.  She and FOB indicated that they would like [place] to offer all interventions possible should an early delivery be warranted.  She has already taken a tour of the NICU.  Thank you for allowing [place] to participate in her care.  Please call with questions.  John Giovanni, DO  Neonatologist  The total length of face-to-face or floor / unit time for this encounter was 30 minutes.  Counseling and / or coordination of care was greater than  fifty percent of the time.

## 2013-09-02 ENCOUNTER — Other Ambulatory Visit: Payer: Self-pay | Admitting: Obstetrics and Gynecology

## 2013-09-03 ENCOUNTER — Inpatient Hospital Stay (HOSPITAL_COMMUNITY)
Admission: AD | Admit: 2013-09-03 | Discharge: 2013-09-03 | Disposition: A | Discharge status: Home / Self Care | Payer: Medicaid Other | Source: Ambulatory Visit | Attending: Obstetrics and Gynecology | Admitting: Obstetrics and Gynecology

## 2013-09-03 ENCOUNTER — Other Ambulatory Visit (HOSPITAL_COMMUNITY): Payer: Self-pay | Admitting: Obstetrics and Gynecology

## 2013-09-03 DIAGNOSIS — O4700 False labor before 37 completed weeks of gestation, unspecified trimester: Secondary | ICD-10-CM | POA: Insufficient documentation

## 2013-09-03 MED ORDER — BETAMETHASONE SOD PHOS & ACET 6 (3-3) MG/ML IJ SUSP
12.5000 mg | Freq: Every day | INTRAMUSCULAR | Status: DC
  Filled 2013-09-03 (×2): qty 2.1

## 2013-09-03 MED ORDER — BETAMETHASONE SOD PHOS & ACET 6 (3-3) MG/ML IJ SUSP
12.0000 mg | Freq: Once | INTRAMUSCULAR | Status: AC
  Administered 2013-09-03: 12 mg via INTRAMUSCULAR
  Filled 2013-09-03: qty 2

## 2013-09-04 ENCOUNTER — Inpatient Hospital Stay (HOSPITAL_COMMUNITY)
Admission: AD | Admit: 2013-09-04 | Discharge: 2013-09-04 | Disposition: A | Discharge status: Home / Self Care | Payer: Medicaid Other | Source: Ambulatory Visit | Attending: Obstetrics and Gynecology | Admitting: Obstetrics and Gynecology

## 2013-09-04 DIAGNOSIS — O4700 False labor before 37 completed weeks of gestation, unspecified trimester: Secondary | ICD-10-CM | POA: Insufficient documentation

## 2013-09-04 MED ORDER — BETAMETHASONE SOD PHOS & ACET 6 (3-3) MG/ML IJ SUSP
12.0000 mg | Freq: Once | INTRAMUSCULAR | Status: AC
  Administered 2013-09-04: 12 mg via INTRAMUSCULAR
  Filled 2013-09-04: qty 2

## 2013-09-06 ENCOUNTER — Encounter (HOSPITAL_COMMUNITY): Payer: Self-pay | Admitting: *Deleted

## 2013-09-06 ENCOUNTER — Inpatient Hospital Stay (HOSPITAL_COMMUNITY)
Admission: AD | Admit: 2013-09-06 | Discharge: 2013-10-23 | DRG: 765 | Disposition: A | Discharge status: Home / Self Care | Payer: Medicaid Other | Source: Ambulatory Visit | Attending: Obstetrics and Gynecology | Admitting: Obstetrics and Gynecology

## 2013-09-06 DIAGNOSIS — F341 Dysthymic disorder: Secondary | ICD-10-CM | POA: Diagnosis present

## 2013-09-06 DIAGNOSIS — Z8659 Personal history of other mental and behavioral disorders: Secondary | ICD-10-CM | POA: Insufficient documentation

## 2013-09-06 DIAGNOSIS — Z302 Encounter for sterilization: Secondary | ICD-10-CM

## 2013-09-06 DIAGNOSIS — O3660X0 Maternal care for excessive fetal growth, unspecified trimester, not applicable or unspecified: Secondary | ICD-10-CM | POA: Diagnosis present

## 2013-09-06 DIAGNOSIS — O30019 Twin pregnancy, monochorionic/monoamniotic, unspecified trimester: Secondary | ICD-10-CM | POA: Diagnosis present

## 2013-09-06 DIAGNOSIS — O360990 Maternal care for other rhesus isoimmunization, unspecified trimester, not applicable or unspecified: Secondary | ICD-10-CM | POA: Diagnosis present

## 2013-09-06 DIAGNOSIS — Z87448 Personal history of other diseases of urinary system: Secondary | ICD-10-CM | POA: Insufficient documentation

## 2013-09-06 DIAGNOSIS — O30009 Twin pregnancy, unspecified number of placenta and unspecified number of amniotic sacs, unspecified trimester: Principal | ICD-10-CM | POA: Diagnosis present

## 2013-09-06 DIAGNOSIS — O99344 Other mental disorders complicating childbirth: Secondary | ICD-10-CM | POA: Diagnosis present

## 2013-09-06 DIAGNOSIS — O26899 Other specified pregnancy related conditions, unspecified trimester: Secondary | ICD-10-CM | POA: Diagnosis present

## 2013-09-06 DIAGNOSIS — Z6791 Unspecified blood type, Rh negative: Secondary | ICD-10-CM | POA: Diagnosis present

## 2013-09-06 DIAGNOSIS — O99334 Smoking (tobacco) complicating childbirth: Secondary | ICD-10-CM | POA: Diagnosis present

## 2013-09-06 DIAGNOSIS — IMO0002 Reserved for concepts with insufficient information to code with codable children: Secondary | ICD-10-CM | POA: Insufficient documentation

## 2013-09-06 DIAGNOSIS — O34219 Maternal care for unspecified type scar from previous cesarean delivery: Secondary | ICD-10-CM | POA: Diagnosis present

## 2013-09-06 DIAGNOSIS — O09299 Supervision of pregnancy with other poor reproductive or obstetric history, unspecified trimester: Secondary | ICD-10-CM | POA: Insufficient documentation

## 2013-09-06 HISTORY — DX: Tubulo-interstitial nephritis, not specified as acute or chronic: N12

## 2013-09-06 HISTORY — DX: History of uterine scar from previous surgery: Z98.891

## 2013-09-06 HISTORY — DX: Supervision of pregnancy with other poor reproductive or obstetric history, unspecified trimester: O09.299

## 2013-09-06 HISTORY — DX: Reserved for concepts with insufficient information to code with codable children: IMO0002

## 2013-09-06 HISTORY — DX: Other specified pregnancy related conditions, unspecified trimester: O26.899

## 2013-09-06 HISTORY — DX: Unspecified blood type, rh negative: Z67.91

## 2013-09-06 LAB — CBC
Hemoglobin: 11.7 g/dL — ABNORMAL LOW (ref 12.0–15.0)
MCHC: 34.6 g/dL (ref 30.0–36.0)
Platelets: 171 10*3/uL (ref 150–400)
RDW: 13.1 % (ref 11.5–15.5)

## 2013-09-06 LAB — TYPE AND SCREEN
ABO/RH(D): O NEG
Antibody Screen: NEGATIVE

## 2013-09-06 MED ORDER — LACTATED RINGERS IV SOLN
INTRAVENOUS | Status: DC
  Administered 2013-09-06 – 2013-09-07 (×2): via INTRAVENOUS

## 2013-09-06 MED ORDER — CALCIUM CARBONATE ANTACID 500 MG PO CHEW
2.0000 | CHEWABLE_TABLET | ORAL | Status: DC | PRN
  Administered 2013-09-19 – 2013-10-01 (×2): 400 mg via ORAL
  Filled 2013-09-06 (×3): qty 1

## 2013-09-06 MED ORDER — ACETAMINOPHEN 325 MG PO TABS
650.0000 mg | ORAL_TABLET | ORAL | Status: DC | PRN
  Administered 2013-09-07 – 2013-10-15 (×9): 650 mg via ORAL
  Filled 2013-09-06 (×9): qty 2

## 2013-09-06 MED ORDER — ZOLPIDEM TARTRATE 5 MG PO TABS
5.0000 mg | ORAL_TABLET | Freq: Every evening | ORAL | Status: DC | PRN
  Administered 2013-09-07 – 2013-10-21 (×41): 5 mg via ORAL
  Filled 2013-09-06 (×41): qty 1

## 2013-09-06 MED ORDER — DOCUSATE SODIUM 100 MG PO CAPS
100.0000 mg | ORAL_CAPSULE | Freq: Every day | ORAL | Status: DC
  Administered 2013-09-07 – 2013-10-20 (×44): 100 mg via ORAL
  Filled 2013-09-06 (×46): qty 1

## 2013-09-06 MED ORDER — PRENATAL MULTIVITAMIN CH
1.0000 | ORAL_TABLET | Freq: Every day | ORAL | Status: DC
  Administered 2013-09-07 – 2013-10-20 (×44): 1 via ORAL
  Filled 2013-09-06 (×45): qty 1

## 2013-09-06 NOTE — H&P (Signed)
Lynn Nolan is a 22 y.o. female, G3P2002 at [redacted]w[redacted]d, presenting for antenatal fetal monitoring for twin gestation -  Mono/mono.  Denies VB, UCs, LOF, recent fever, resp or GI c/o's, UTI or PIH s/s. GFM.  Patient Active Problem List   Diagnosis Date Noted  . Twin gestation, monochorionic monoamniotic 09/06/2013  . History of pyelonephritis 09/06/2013  . History of postpartum depression 09/06/2013  . History of cesarean delivery, currently pregnant 09/06/2013  . H/o LGA (large for gestational age) fetus 09/06/2013  . Rh negative status during pregnancy 09/06/2013  . Spina bifida, child of prior pregnancy, currently pregnant 09/06/2013    History of present pregnancy: Patient entered care at 16 weeks.   EDC of 12/16/13 was established by 16 week U/S.    Anatomy scan:  18 weeks, Fetus A placenta - posterior; Fetus B placenta - posterior.   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; limited anatomy  Additional [place] evaluations:  [redacted]w[redacted]d Follow up - Active mono/mono twins; cord entanglement is again seen; no previa, AFV GA appropriate [redacted]w[redacted]d for abdominal pain -  Limited to evaluation of cervix; EV views of cervix - normal length without funneling; cardiac activity x 2 [redacted]w[redacted]d Follow up - All fetal anatomy has been visualized in both twins except the LVOT of twin B; appropriate interval fetal growth; insignificant fetal grwoth discordance of 10%; Normal fluid; normal interval fetal anatomy x 2; cord entanglement is noted   [redacted]w[redacted]d - Both fetuses viable; TA views of cervix were normal   Significant prenatal events:  Neonatology consult on 12/19   Last evaluation:  12/24 at [redacted]w[redacted]d   OB History   Grav Para Term Preterm Abortions TAB SAB Ect Mult Living   3 2 2       2      Past Medical  History  Diagnosis Date  . Anxiety   . Depression    Past Surgical History  Procedure Laterality Date  . Tonsillectomy    . Cesarean section  x2  . Adenoidectomy     Family History: family history is not on file. Social History:  reports that she has been smoking Cigarettes.  She has a 3.75 pack-year smoking history. She has never used smokeless tobacco. She reports that she does not drink alcohol or use illicit drugs.   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 Amoxicillin Other (See Comments)    Childhood allergy  . Erythrocin Other (See Comments)    Childhood allergy  . Penicillins Other (See Comments)    Childhood allergy   . Wellbutrin [Bupropion Hcl] Nausea Only and Other (See Comments)    Dizziness       Blood pressure 109/57, pulse 95, temperature 98.9 F (37.2 C), temperature source Oral, resp. rate 20, height 5\' 2"  (1.575 m), weight 148 lb (67.132 kg), last menstrual period 03/11/2013.  Chest clear Heart RRR without murmur Abd gravid, NT Ext: WNL    Prenatal labs: ABO, Rh:  O neg Antibody:  Neg Rubella:   Immune RPR:   Neg HBsAg:   Neg HIV:   Neg GBS:  Not collected Sickle cell/Hgb electrophoresis:  n/a Pap:  07/01/13 LSIL with HPV/Mild dysplasia/CIN1 - Rec. repeat in 1 year GC:  Neg Chlamydia:  Neg Genetic screenings:  Quad neg Glucola:  127 Other:  none   Assessment/Plan: Mono/Mono twin gestation at [redacted]w[redacted]d Twin A is on the left, Twin B is on the right  MFM recommends:  Steroid administration if fhr abnormalities are noted or at 24wks -  completed 12/23 and 12/24 Repeated steroid administration at 30wks Sonograms at least monthly with EFW and arterial and venous dopplers Delivery at 32wks at level III center Rhogam at 28 weeks  Rowan Blase, MSN 09/06/2013, 9:41 PM

## 2013-09-07 ENCOUNTER — Inpatient Hospital Stay (HOSPITAL_COMMUNITY): Payer: Medicaid Other

## 2013-09-07 NOTE — Progress Notes (Signed)
Pt up to shower

## 2013-09-07 NOTE — Progress Notes (Signed)
Hospital day # 1 pregnancy at [redacted]w[redacted]d--Mono/mono twins, s/p betamethasone course 12/23 and 12/24.  S:  Doing well, reports good fetal activity      Perception of contractions: none      Vaginal bleeding: None       Vaginal discharge:  None  O: BP 103/59  Pulse 85  Temp(Src) 98.4 F (36.9 C) (Oral)  Resp 17  Ht 5\' 2"  (1.575 m)  Wt 148 lb (67.132 kg)  BMI 27.06 kg/m2  LMP 03/11/2013      Fetal tracings:  Reassuring x 2      Contractions:   Very occasional, mild      Uterus non-tender      Extremities: no significant edema and no signs of DVT          Labs:   Results for orders placed during the hospital encounter of 09/06/13 (from the past 24 hour(s))  CBC     Status: Abnormal   Collection Time    09/06/13 10:20 PM      Result Value Range   WBC 15.9 (*) 4.0 - 10.5 K/uL   RBC 3.62 (*) 3.87 - 5.11 MIL/uL   Hemoglobin 11.7 (*) 12.0 - 15.0 g/dL   HCT 25.3 (*) 66.4 - 40.3 %   MCV 93.4  78.0 - 100.0 fL   MCH 32.3  26.0 - 34.0 pg   MCHC 34.6  30.0 - 36.0 g/dL   RDW 47.4  25.9 - 56.3 %   Platelets 171  150 - 400 K/uL  TYPE AND SCREEN     Status: None   Collection Time    09/06/13 10:20 PM      Result Value Range   ABO/RH(D) O NEG     Antibody Screen NEG     Sample Expiration 09/09/2013     Last interval growth [place] on 08/15/13:  Twin A--473 gm, 1+1, 40%ile, normal fluid, breech.  Twin B--428 gm 15 oz, 24%ile, normal fluid, vtx.  No dopplers done.  Limited US during night after admission for FHR localization:  Vtx/vtx presentation     Meds: Received betamethasone course 12/23 and 12/24  A: [redacted]w[redacted]d with mono/mono twins     Rh negative     Previous C/S x 2     Baby B right CP cyst     Stable  P: Continue current plan of care     Per previous MFM note from Dr. Katherina Right on 11/4, following recommendations were made:     Hospitalization for continuous EFM     Umb and arterial Doppler studies and interval growth assessment by [place] at least monthly--no Dopplers done yet, last [place] for  growth 12/4 with MFM.     Repeat steroid course at 30 weeks     Elective C/S at 32 weeks.          Patient had neonatology consult with Dr. Algernon Huxley on 12/19--will repeat prn.     SW consult for prolonged hospitalization     Will consult for plan for f/u US timing.     Support to patient for anticipated long-term hospitalization     MDs will follow  Nigel Bridgeman CNM, [place] 09/07/2013 9:22 AM

## 2013-09-07 NOTE — Progress Notes (Signed)
Hospital Day #2 Twin Pregnancy (Mono/Mono) at 25 5/7 weeks.  Reviewed CNM note.  Subjective: no complaints.  Denies contractions, LOF or VB. Monitors uncomfortable but agreeable to continuous monitoring.  Objective: Blood pressure 103/59, pulse 85, temperature 98.4 F (36.9 C), temperature source Oral, resp. rate 17, height 5\' 2"  (1.575 m), weight 67.132 kg (148 lb), last menstrual period 03/11/2013.  Physical Exam:  General: NAD, A&O x 3 Abd:  Gravid, non tender. DVT Evaluation: No evidence of DVT seen on physical exam.   EM:  Cat 1 tracing x 2, occasional variable decelerations.  No contractions.  Recent Labs  09/06/13 2220  HGB 11.7*  HCT 33.8*    Assessment/Plan: Twin Pregnancy at 25 5/7 weeks, Mono/Mono, cord entanglement on ultrasound.  Reassuring fetal status. S/p BMZ 12/23 and 12/24. S/p NICU consult on 12/19. H/o previous c-section x 2. Rh Negative.  Plan: Continue inpatient fetal surveillance via continuous external fetal monitoring and tocometry. Inpatient Bedrest. Per Dr. Katherina Right, MFM,  Repeat BMZ at 30 weeks, delivery via repeat c-section at 32 weeks. Ultrasound for growth, Dopplers every 4 weeks.  Due at 26 3/7 weeks.  Will let primary OB order with MFM if not already done. 1 hour Glucola b/w 26-28 weeks if not already done. Rhogam by 28 weeks or earlier due to anticipated preterm delivery. Agree with SW consult for prolonged hospitalization. Discussed management plan with pt.  No questions from pt.     LOS: 1 day   Lynn Nolan 09/07/2013, 1:37 PM

## 2013-09-07 NOTE — Progress Notes (Signed)
  Subjective: RN having difficulty with FHT.  Objective: BP 109/57  Pulse 95  Temp(Src) 98.9 F (37.2 C) (Oral)  Resp 20  Ht 5\' 2"  (1.575 m)  Wt 148 lb (67.132 kg)  BMI 27.06 kg/m2  LMP 03/11/2013   Assessment / Plan: Ordered bedside u/s for positioning. RN states the u/s really helped and is now able to trace twins better.  Haroldine Laws 09/07/2013, 3:08 AM

## 2013-09-08 MED ORDER — INFLUENZA VAC SPLIT QUAD 0.5 ML IM SUSP
0.5000 mL | INTRAMUSCULAR | Status: AC
  Administered 2013-09-09: 0.5 mL via INTRAMUSCULAR
  Filled 2013-09-08: qty 0.5

## 2013-09-08 MED ORDER — SODIUM CHLORIDE 0.9 % IJ SOLN
3.0000 mL | Freq: Two times a day (BID) | INTRAMUSCULAR | Status: DC
  Administered 2013-09-08 – 2013-10-22 (×75): 3 mL via INTRAVENOUS

## 2013-09-08 NOTE — Progress Notes (Signed)
Hospital Day #3 Twin Pregnancy (Mono/Mono) at 25 6/7 weeks.   Subjective: Contractions this am on monitor.  Pt reports they were mild.  Stopped 45 minutes after emptying her bladder per RN.  Denies LOF, VB.  Active fetal movement x 2.  Prefers SCDs after discussion.  Objective: Blood pressure 115/63, pulse 98, temperature 98.9 F (37.2 C), temperature source Oral, resp. rate 18, height 5\' 2"  (1.575 m), weight 67.132 kg (148 lb), last menstrual period 03/11/2013.  Physical Exam:  General: NAD, A&O x 3 Abd:  Gravid, non tender. DVT Evaluation: No evidence of DVT seen on physical exam.  SCDs or TEDs not on.   EM:  Cat 1 tracing x 2, occasional variable decelerations.  Contractions ever 8 min, resolved.  Recent Labs  09/06/13 2220  HGB 11.7*  HCT 33.8*    Assessment/Plan: Twin Pregnancy at 25 6/7 weeks, Mono/Mono, cord entanglement on ultrasound.  Reassuring fetal status. Preterm contractions, resolved. S/p BMZ 12/23 and 12/24. S/p NICU consult on 12/19. H/o previous c-section x 2. Rh Negative.  Plan: Continue inpatient fetal surveillance via continuous external fetal monitoring and tocometry. Inpatient Bedrest. Have CNM check cervix for baseline exam. Place SCDS. See previous note for recommendations.     LOS: 2 days   Fathima Bartl 09/08/2013, 1:00 PM

## 2013-09-09 LAB — TYPE AND SCREEN
ABO/RH(D): O NEG
Antibody Screen: NEGATIVE

## 2013-09-09 MED ORDER — RHO D IMMUNE GLOBULIN 1500 UNIT/2ML IJ SOLN
300.0000 ug | Freq: Once | INTRAMUSCULAR | Status: AC
  Administered 2013-09-09: 300 ug via INTRAMUSCULAR
  Filled 2013-09-09: qty 2

## 2013-09-09 NOTE — Progress Notes (Signed)
Patient ID: Lynn Nolan, female   DOB: Feb 04, 1991, 22 y.o.   MRN: [id number] Lynn Nolan is a 22 y.o. G3P2002 at [redacted]w[redacted]d admitted for continuous monitoring secondary to mono/mono twins.  She is s/p BMZ 12/24 and 25.  Subjective: Pt reports lots of fetal movement and asks to be off monitor for some time.  I explained the rationale for continuous monitoring and she understands and is ok as is right now.  If she gets to a point where she really wants some time free of monitors, she will let nurse know.  She reports occas ctxs but none today.  She asks about getting rhogam since she is Rh neg and being delivered early and at risk for delivery early.  I see very little downside, if any, to giving it now and maybe even some benefit so will therefore order.  Objective: BP 109/53  Pulse 99  Temp(Src) 98.4 F (36.9 C) (Oral)  Resp 18  Ht 5\' 2"  (1.575 m)  Wt 67.132 kg (148 lb)  BMI 27.06 kg/m2  LMP 03/11/2013      Physical Exam:  Gen: alert Chest/Lungs: cta bilaterally  Heart/Pulse: RRR  Abdomen: soft, gravid, nontender Uterine fundus: soft, nontender EXT: negative Homan's b/l, no evidence of DVT  FHT:  Twin A 130s, + variability with lots of LOC, no obvious decels.  Twin B 130s-140s with variability and similar LOC, no obvious decels. UC:   Infrequent  SVE:    deferred  Labs: Lab Results  Component Value Date   WBC 15.9* 09/06/2013   HGB 11.7* 09/06/2013   HCT 33.8* 09/06/2013   MCV 93.4 09/06/2013   PLT 171 09/06/2013    Assessment and Plan: has Twin gestation, monochorionic monoamniotic; History of pyelonephritis; History of postpartum depression; History of cesarean delivery, currently pregnant; H/o LGA (large for gestational age) fetus; Rh negative status during pregnancy; and Spina bifida, child of prior pregnancy, currently pregnant on her problem list.  Rhogam GBS U/S for EFW and arterial and venous dopplers this week Cont current plan of  care  Lynn Nolan Y 09/09/2013, 2:49 PM

## 2013-09-09 NOTE — Progress Notes (Signed)
Went to Pt's room to readjust fetal cardio and found pt in tears with spouse at her side and pt telling spouse to get out.  Asked pt if she was okay and pt stated that she was.  Audible fetal movement heard.  Pt given her privacy at this time.

## 2013-09-09 NOTE — Progress Notes (Signed)
Monitors removed for pt. To shower

## 2013-09-10 ENCOUNTER — Inpatient Hospital Stay (HOSPITAL_COMMUNITY): Payer: Medicaid Other

## 2013-09-10 LAB — CBC
MCH: 33 pg (ref 26.0–34.0)
MCV: 94.3 fL (ref 78.0–100.0)
Platelets: 161 10*3/uL (ref 150–400)
RBC: 3.51 MIL/uL — ABNORMAL LOW (ref 3.87–5.11)

## 2013-09-10 LAB — RH IG WORKUP (INCLUDES ABO/RH)
ABO/RH(D): O NEG
Fetal Screen: NEGATIVE
Unit division: 0

## 2013-09-10 NOTE — Progress Notes (Signed)
Patient ID: Lynn Nolan, female   DOB: 1991/04/07, 22 y.o.   MRN: [id number] Lynn Nolan is a 22 y.o. G3P2002 at [redacted]w[redacted]d admitted for continuous monitoring secondary to Mono/Mono twins   Subjective: No complaints, occas cramping.  Objective: BP 119/63  Pulse 85  Temp(Src) 98.3 F (36.8 C) (Oral)  Resp 18  Ht 5\' 2"  (1.575 m)  Wt 67.132 kg (148 lb)  BMI 27.06 kg/m2  SpO2 99%  LMP 03/11/2013      Physical Exam:  Gen: alert Chest/Lungs: cta bilaterally  Heart/Pulse: RRR  Abdomen: soft, gravid, nontender Uterine fundus: soft, nontender EXT: negative Homan's b/l, no calf tenderness  FHT:  FHR: 140s x 2 bpm, variability: moderate, small accelerations:  Present,  decelerations:  Absent UC:   none SVE:    deferred  Labs: Lab Results  Component Value Date   WBC 15.9* 09/06/2013   HGB 11.7* 09/06/2013   HCT 33.8* 09/06/2013   MCV 93.4 09/06/2013   PLT 171 09/06/2013    Assessment and Plan: has Twin gestation, monochorionic monoamniotic; History of pyelonephritis; History of postpartum depression; History of cesarean delivery, currently pregnant; H/o LGA (large for gestational age) fetus; Rh negative status during pregnancy; and Spina bifida, child of prior pregnancy, currently pregnant on her problem list.  Cont current mgmt Fetal status x 2 reassuring U/S due today GBS pending S/p rhogam yesterday Encouraged SCD use  Lynn Nolan Y 09/10/2013, 10:15 AM

## 2013-09-10 NOTE — Progress Notes (Signed)
Lynn Nolan  was seen today for an ultrasound appointment.  See full report in [facility].  Impression: MC/MA twin gestation with best dates of 26 1/7 weeks Interval growth is appropriate and concordant (4% intertwin growth discordance noted) Some cord entanglement is appreciated  Twin A:  Maternal right, cephalic, posterior placenta Interval growth is appropriate (45th %tile) Normal amniotic fluid volume  Twin B: Maternal left, transverse lie with fetal head to maternal right, posterior placenta Interval growth is appropriate (38th %tile) Normal amniotic fluid volume  Recommendations: Continue inpatient observation, continuous fetal monitoring Recommend follow up ultrasound for fluid check in 2 weeks; growth in 4 weeks.  Alpha Gula, MD

## 2013-09-11 ENCOUNTER — Ambulatory Visit (HOSPITAL_COMMUNITY): Payer: Medicaid Other

## 2013-09-11 NOTE — Progress Notes (Signed)
Hospital day # 5 pregnancy at [redacted]w[redacted]d Mono-mono twins here foe CEFM  S: well, reports good fetal activity      Contractions:sporadic and mild      Vaginal bleeding:none now       Vaginal discharge: no significant change  O: BP 105/56  Pulse 87  Temp(Src) 98.1 F (36.7 C) (Oral)  Resp 18  Ht 5\' 2"  (1.575 m)  Wt 152 lb (68.947 kg)  BMI 27.79 kg/m2  SpO2 99%  LMP 03/11/2013      Fetal tracings:reviewed and reassuring for 26+ weeks      Uterus gravid and non-tender      Extremities: no significant edema and no signs of DVT  A: [redacted]w[redacted]d with Mono-mono twins for CEFM, AFV surveillance every 2 weeks, growth surveillance every 4 weeks.      unchanged  P: continue current plan of care. Spoke with Dr Claudean Severance who confirms cord dopplers are not recommended when watching for cord entanglement. Continuous fetal monitoring will tell us real time if a significant event occurs. Plan repeat c/s at 32 weeks after repeat course of BMZ at 30 weeks  Quinne Pires A  MD 09/11/2013 1:03 PM

## 2013-09-12 ENCOUNTER — Encounter (HOSPITAL_COMMUNITY): Payer: Self-pay

## 2013-09-12 LAB — CULTURE, BETA STREP (GROUP B ONLY)

## 2013-09-12 LAB — OB RESULTS CONSOLE GBS: GBS: POSITIVE

## 2013-09-12 NOTE — Progress Notes (Signed)
Hospital day # 6 pregnancy at 4310w3d Mono-mono twins here for CEFM  S: well, reports good fetal activity      Contractions:sporadic and mild      Vaginal bleeding:none now       Vaginal discharge: no significant change  O: BP 112/60  Pulse 83  Temp(Src) 98.6 F (37 C) (Oral)  Resp 18  Ht 5\' 2"  (1.575 m)  Wt 152 lb (68.947 kg)  BMI 27.79 kg/m2  SpO2 99%  LMP 03/11/2013      Fetal tracings:reviewed and reassuring for 26+ weeks      Uterus gravid and non-tender      Extremities: no significant edema and no signs of DVT  A: 1310w3d with Mono-mono twins for CEFM, AFV surveillance every 2 weeks, growth surveillance every 4 weeks.      unchanged  P: continue current plan of care.  Plan repeat c/s at 32 weeks after repeat course of BMZ at 30 weeks  Duran Ohern A  MD 09/12/2013 8:15 AM

## 2013-09-13 NOTE — Progress Notes (Signed)
Pt back to bed from shower.  Monitors applied to abdomen

## 2013-09-13 NOTE — Progress Notes (Signed)
Hospital day # 7 pregnancy at 6659w4d--Mono/mono twins.  S:  Doing well, but feeling the effects of anticipated prolonged hospitalization.  FOB at bedside.      Perception of contractions: Irregular, "no more than usual", mild      Vaginal bleeding: None       Vaginal discharge:  None  O: BP 88/32  Pulse 83  Temp(Src) 98.5 F (36.9 C) (Oral)  Resp 18  Ht 5\' 2"  (1.575 m)  Wt 152 lb (68.947 kg)  BMI 27.79 kg/m2  SpO2 99%  LMP 03/11/2013      Fetal tracings:  FHR reassuring x 2, both fetuses active      Contractions:   1-2/hour, mild irritability      Uterus non-tender      Extremities: extremities normal, atraumatic, no cyanosis or edema and no significant edema and no signs of DVT                Meds: Completed betamethasone course 12/24.                 Beta strep pending from 12/29  A: 3159w4d with mono/mono twins     Stable  P: Continue current plan of care      Upcoming tests/treatments:  Anticipate maintaining continuous EFM, with repeat C/S planned at 32 weeks, repeat       betamethasone course at 30 weeks.      MDs will follow      Support to patient for long-term hospitalization issues.  Nigel BridgemanLATHAM, Chiyoko Torrico CNM, [place] 09/13/2013 10:41 AM

## 2013-09-14 NOTE — Progress Notes (Signed)
Patient ID: Lynn PancoastBrittany D Nolan, female   DOB: 07/13/1991, 23 y.o.   MRN: [id number] Pt without complaints.  No leakage of fluid or VB.  Good FM  BP 104/55  Pulse 91  Temp(Src) 98.1 F (36.7 C) (Oral)  Resp 18  Ht 5\' 2"  (1.575 m)  Wt 152 lb (68.947 kg)  BMI 27.79 kg/m2  SpO2 99%  LMP 03/11/2013  FHTS Baseline: 130 and 140 bpm, Variability: Fair (1-6 bpm) and appropriate for gestaional age  Toco none  Pt in NAD CV RRR Lungs CTAB abd  Gravid soft and NT GU no vb EXt no calf tenderness Results for orders placed during the hospital encounter of 09/06/13 (from the past 72 hour(s))  OB RESULTS CONSOLE GBS     Status: None   Collection Time    09/12/13 12:00 AM      Result Value Range   GBS Positive    TYPE AND SCREEN     Status: None   Collection Time    09/12/13 10:25 PM      Result Value Range   ABO/RH(D) O NEG     Antibody Screen POS     Sample Expiration 09/15/2013     Antibody Identification PASSIVELY ACQUIRED ANTI-D     DAT, IgG NEG     Unit Number [id number]     Blood Component Type RED CELLS,LR     Unit division 00     Status of Unit ALLOCATED     Transfusion Status OK TO TRANSFUSE     Crossmatch Result COMPATIBLE     Unit Number [id number]     Blood Component Type RED CELLS,LR     Unit division 00     Status of Unit ALLOCATED     Transfusion Status OK TO TRANSFUSE     Crossmatch Result COMPATIBLE      Assessment and Plan 7847w5d  Mono Mono twins Pt with right shin tenderness.  No calf tenderness will try to get PT Continue current care

## 2013-09-14 NOTE — Progress Notes (Signed)
PT Note  Order received and chart reviewed.  Full evaluation to follow on Monday. Thank you, 09/14/2013 Corlis HoveMargie Theodoro Koval, PT [phone]

## 2013-09-15 NOTE — Progress Notes (Signed)
Patient ID: Lynn Nolan, female   DOB: 10/07/1990, 23 y.o.   MRN: [id number] Lynn PancoastBrittany D Schmaltz is a 23 y.o. G3P2002 at 4539w6d admitted for monitoring secondary to Mono/Mono Twins   Subjective: No complaints.  Denies abnl discharge or LOF or VB.  Occas ctxs.  Objective: BP 90/44  Pulse 104  Temp(Src) 98.5 F (36.9 C) (Oral)  Resp 18  Ht 5\' 2"  (1.575 m)  Wt 68.947 kg (152 lb)  BMI 27.79 kg/m2  SpO2 99%  LMP 03/11/2013      Physical Exam:  Gen: alert Chest/Lungs: cta bilaterally  Heart/Pulse: RRR  Abdomen: soft, gravid, nontender Uterine fundus: soft, nontender Neurological: AOx3 EXT: negative Homan's b/l, no calf tenderness   FHT:  FHR: 130s-140s bpm, variability: moderate, small accelerations:  Present,  decelerations:  Absent x 2 UC:   rare SVE:    deferred  Labs: Lab Results  Component Value Date   WBC 12.8* 09/10/2013   HGB 11.6* 09/10/2013   HCT 33.1* 09/10/2013   MCV 94.3 09/10/2013   PLT 161 09/10/2013    Assessment and Plan: has Twin gestation, monochorionic monoamniotic; History of pyelonephritis; History of postpartum depression; History of cesarean delivery, currently pregnant; H/o LGA (large for gestational age) fetus; Rh negative status during pregnancy; and Spina bifida, child of prior pregnancy, currently pregnant on her problem list.  Cont current mgmt PT plans to come by on Monday  Kerstin Crusoe Y 09/15/2013, 10:52 AM

## 2013-09-15 NOTE — Progress Notes (Signed)
Maternal heart rate tracing noted. Patient sitting high fowler position to eat.

## 2013-09-16 LAB — TYPE AND SCREEN
ABO/RH(D): O NEG
Antibody Screen: POSITIVE
DAT, IgG: NEGATIVE
Unit division: 0
Unit division: 0

## 2013-09-16 NOTE — Progress Notes (Signed)
Antenatal Nutrition Assessment:  Currently  [redacted] weeks gestation, with Twins. Height  62 "  Weight 152 lbs  pre-pregnancy weight 128 lbs .  Pre-pregnancy  BMI 23.4  IBW 110 lbs Total weight gain 24.lbs Weight gain goals 37-54 lbs Estimated needs: 21-2300 kcal/day, 75-85 grams protein/day, 2.4 liters fluid/day  Antenatal regular diet tolerated well, appetite fair. Snack menu provided. Family brings food from outside Current diet prescription will provide for increased needs.  No abnormal nutrition related labs  Nutrition Dx: Increased nutrient needs r/t pregnancy and fetal growth requirements aeb 27 week twin gestation.  No educational needs assessed at this time.  Lynn Nolan M.Odis LusterEd. R.D. LDN Neonatal Nutrition Support Specialist Pager [phone]

## 2013-09-16 NOTE — Progress Notes (Signed)
Ur chart review completed.  

## 2013-09-16 NOTE — Progress Notes (Signed)
Hospital day # 10 pregnancy at 5998w0d mono-mono twins  S: well, reports good fetal activity      Contractions:sporadic and painless      Vaginal bleeding:none now       Vaginal discharge: no significant change  O: BP 111/55  Pulse 81  Temp(Src) 98.5 F (36.9 C) (Oral)  Resp 18  Ht 5\' 2"  (1.575 m)  Wt 152 lb (68.947 kg)  BMI 27.79 kg/m2  SpO2 100%  LMP 03/11/2013      Fetal tracings:reviewed and reassuring      Uterus gravid and non-tender      Extremities: no significant edema and no signs of DVT  A: 2898w0d with mono-mono twins     stable  P: continue current plan of care  Shiryl Ruddy A  MD 09/16/2013 10:53 AM

## 2013-09-17 NOTE — Progress Notes (Signed)
Patient ID: Lynn Nolan, female   DOB: 01/11/1991, 23 y.o.   MRN: [id number]  Lynn PancoastBrittany D Luhman is a 23 y.o. G3P2002 at 7061w1d   Subjective: No complaints.  Denies ctxs, lof or change in discharge.  Objective: BP 89/43  Pulse 90  Temp(Src) 98.3 F (36.8 C) (Oral)  Resp 18  Ht 5\' 2"  (1.575 m)  Wt 68.947 kg (152 lb)  BMI 27.79 kg/m2  SpO2 100%  LMP 03/11/2013 I/O last 3 completed shifts: In: 3 [I.V.:3] Out: -     Physical Exam:  Gen: alert Chest/Lungs: cta bilaterally  Heart/Pulse: RRR  Abdomen: soft, gravid, nontender Uterine fundus: soft, nontender Skin & Color: warm and dry  EXT: negative Homan's b/l, no calf tenderness  FHT:  NWG:NFAOFHR:Twin A 150s bpm, variability: moderate,  Small accelerations:  Present,  decelerations:  Absent  Twin B 130s-140s bpm, variability: moderate,  Small accelerations:  Present,  decelerations:  Absent   UC:   rare SVE:    deferred  Labs: Lab Results  Component Value Date   WBC 12.8* 09/10/2013   HGB 11.6* 09/10/2013   HCT 33.1* 09/10/2013   MCV 94.3 09/10/2013   PLT 161 09/10/2013    Assessment and Plan: has Twin gestation, monochorionic monoamniotic; History of pyelonephritis; History of postpartum depression; History of cesarean delivery, currently pregnant; H/o LGA (large for gestational age) fetus; Rh negative status during pregnancy; and Spina bifida, child of prior pregnancy, currently pregnant on her problem list.  Cont current mgmt Fetal status is overall reassuring but there is lots of LOC because of baby movement and positioning SCDs to LEs at least 8hrs or will have to consider lovenox - pt understands and says she will wear them more  Glucola on 09/23/13  Blanche Gallien Y 09/17/2013, 11:06 AM

## 2013-09-17 NOTE — Evaluation (Signed)
Physical Therapy Evaluation - late entry for 09/16/12 Patient Details Name: Lynn Nolan MRN: [id number] DOB: 1991-01-11 Today's Date: 09/17/2013 Time: 0320-0345 PT Time Calculation (min): 25 min  PT Assessment / Plan / Recommendation History of Present Illness  pt admitted for antenatal monitoring on mono/mono twins  Clinical Impression  Patient demonstrated understanding of all exercises and positioning recommendations.  Will monitor patient via chart for changes/needs.  Please feel free to reconsult as needed, especially if patient has needs after delivery.  Patient did inform PT that she has had sacral/coccyx pain since last delivery.  Through discussion feel patient may have a rotated coccyx and would recommend an OP PT consult after delivery.      PT Assessment  All further PT needs can be met in the next venue of care    Follow Up Recommendations  Outpatient PT    Does the patient have the potential to tolerate intense rehabilitation      Barriers to Discharge        Equipment Recommendations  None recommended by PT    Recommendations for Other Services     Frequency      Precautions / Restrictions Precautions Precaution Comments: bedrest with BRP   Pertinent Vitals/Pain Patient stated she had mild back pain, we discussed ways to reposition to decrease back pain.      Mobility       Exercises Antenatal Exercises Ankle Circles/Pumps: AROM;Both;10 reps;Supine Quad Sets: AROM;Both;10 reps;Supine Short Arc Quad: AROM;Both;10 reps;Supine Hip ABduction/ADduction: AROM;Both;10 reps;Supine Sidelying Hip Flexor Stretch: PROM;Right Other Exercises Other Exercises: discussed positioning with patient to decrease back pain and to maintain alignment; encouraged patient to rotate positions frequently during the day.   PT Diagnosis: Acute pain  PT Problem List: Pain (sacral/coccyx pain) PT Treatment Interventions:       PT Goals(Current goals can be found in the care  plan section) Acute Rehab PT Goals PT Goal Formulation: No goals set, d/c therapy  Visit Information  Last PT Received On: 09/16/13 Assistance Needed: +1 History of Present Illness: pt admitted for antenatal monitoring on mono/mono twins       Prior [place] expects to be discharged to:: Private residence Living Arrangements: Spouse/significant other Prior Function Level of Independence: Independent Communication Communication: No difficulties    Cognition  Cognition Arousal/Alertness: Awake/alert Behavior During Therapy: WFL for tasks assessed/performed Overall Cognitive Status: Within Functional Limits for tasks assessed    Extremity/Trunk Assessment Upper Extremity Assessment Upper Extremity Assessment: Overall WFL for tasks assessed Lower Extremity Assessment Lower Extremity Assessment: Overall WFL for tasks assessed   Balance    End of Session PT - End of Session Activity Tolerance: Patient tolerated treatment well Patient left: in bed;with call bell/phone within reach  [place], [place], [place] 09/17/2013, 8:38 AM

## 2013-09-18 ENCOUNTER — Encounter (HOSPITAL_COMMUNITY): Payer: Self-pay | Admitting: *Deleted

## 2013-09-18 LAB — TYPE AND SCREEN
ABO/RH(D): O NEG
ANTIBODY SCREEN: POSITIVE
DAT, IgG: NEGATIVE
UNIT DIVISION: 0
Unit division: 0

## 2013-09-18 NOTE — Progress Notes (Signed)
Patient ID: Lynn PancoastBrittany D Nolan, female   DOB: 03/30/1991, 23 y.o.   MRN: [id number] Pt without complaints.  No leakage of fluid or VB.  Good FM  BP 113/66  Pulse 95  Temp(Src) 98 F (36.7 C) (Oral)  Resp 20  Ht 5\' 2"  (1.575 m)  Wt 152 lb 4.8 oz (69.083 kg)  BMI 27.85 kg/m2  SpO2 100%  LMP 03/11/2013  FHTS Baby A 140, Baby 130 both with good varibility  Toco none  Pt in NAD CV RRR Lungs CTAB abd  Gravid soft and NT GU no vb EXt no calf tenderness Results for orders placed during the hospital encounter of 09/06/13 (from the past 72 hour(s))  TYPE AND SCREEN     Status: None   Collection Time    09/15/13 10:40 PM      Result Value Range   ABO/RH(D) O NEG     Antibody Screen POS     Sample Expiration 09/18/2013     DAT, IgG NEG     Antibody Identification PASSIVELY ACQUIRED ANTI-D     Unit Number [id number]     Blood Component Type RED CELLS,LR     Unit division 00     Status of Unit ALLOCATED     Transfusion Status OK TO TRANSFUSE     Crossmatch Result COMPATIBLE     Unit Number [id number]     Blood Component Type RBC LR PHER1     Unit division 00     Status of Unit ALLOCATED     Transfusion Status OK TO TRANSFUSE     Crossmatch Result COMPATIBLE      Assessment and Plan 9154w2d  Mono Mono Twins Pt stable give routine care

## 2013-09-18 NOTE — Progress Notes (Signed)
Patient sitting up eating.  Patient to notify RN when finished.

## 2013-09-18 NOTE — Progress Notes (Signed)
Audible fetal movement heard and seen x2

## 2013-09-18 NOTE — Progress Notes (Signed)
Patient sitting to eat. Physician at side and is aware of fhr. Pt to notify nurse when she is done with eating.

## 2013-09-18 NOTE — Progress Notes (Signed)
Dr. Normand Sloopillard called to check on patient status. Aware of fhr's . Both fetuses are very active this morning. Will continue to attempt to continouesly monitor both fetuses at 27.2 wks. Gestation.

## 2013-09-19 ENCOUNTER — Encounter (HOSPITAL_COMMUNITY): Payer: Self-pay | Admitting: *Deleted

## 2013-09-19 MED ORDER — HYDROCORTISONE 1 % EX CREA
TOPICAL_CREAM | Freq: Three times a day (TID) | CUTANEOUS | Status: DC | PRN
  Administered 2013-09-19: via TOPICAL
  Filled 2013-09-19: qty 28

## 2013-09-19 NOTE — Progress Notes (Signed)
23 y.o. year old female,at 1544w3d gestation.  SUBJECTIVE:  The patient reports that she is doing well. She denies leaking fluid and vaginal bleeding.  OBJECTIVE:  BP 116/63  Pulse 99  Temp(Src) 98.3 F (36.8 C) (Oral)  Resp 18  Ht 5\' 2"  (1.575 m)  Wt 152 lb 4.8 oz (69.083 kg)  BMI 27.85 kg/m2  SpO2 100%  LMP 03/11/2013  Fetal Heart Tones:  Category 1x2  Contractions:          Very few  Abdomen nontender  ASSESSMENT:  6044w3d Weeks Pregnancy  Monochorionic and Monoamniotic twins  PLAN:  Continue hospital management.  Leonard SchwartzArthur Vernon Nera Haworth, M.D.

## 2013-09-20 NOTE — Progress Notes (Signed)
Hospital day # 14 pregnancy at 8834w4d with mono-mono twin pregnancy  S: well, reports good fetal activity      Contractions:none      Vaginal bleeding:none now       Vaginal discharge: no significant change  O: BP 85/51  Pulse 88  Temp(Src) 98.4 F (36.9 C) (Oral)  Resp 18  Ht 5\' 2"  (1.575 m)  Wt 152 lb 4.8 oz (69.083 kg)  BMI 27.85 kg/m2  SpO2 100%  LMP 03/11/2013      Fetal tracings:reviewed and reassuring      Uterus non-tender      Extremities: no significant edema and no signs of DVT  A: 6534w4d with mono-mono twins     unchanged  P: continue current plan of care      Next ultrasound is 09/24/13      Cesarean section planned at 32 weeks: 10/21/13  [facility]burgRIVARD,Nichlas Pitera A  MD 09/20/2013 12:23 PM

## 2013-09-22 LAB — TYPE AND SCREEN
ABO/RH(D): O NEG
Antibody Screen: POSITIVE
DAT, IgG: NEGATIVE
Unit division: 0
Unit division: 0

## 2013-09-22 MED ORDER — ONDANSETRON 4 MG PO TBDP
4.0000 mg | ORAL_TABLET | Freq: Three times a day (TID) | ORAL | Status: DC | PRN
  Administered 2013-09-22: 4 mg via ORAL
  Filled 2013-09-22 (×2): qty 1

## 2013-09-22 NOTE — Progress Notes (Signed)
Hospital day # 16 pregnancy at 5252w6d--Mono/mono twins  S:  Feeling nauseated this am, but has not eaten yet.      Reports good fetal activity      Perception of contractions: Very occasional      Vaginal bleeding: None       Vaginal discharge:  None  O: BP 113/61  Pulse 102  Temp(Src) 98.2 F (36.8 C) (Oral)  Resp 18  Ht 5\' 2"  (1.575 m)  Wt 152 lb 4.8 oz (69.083 kg)  BMI 27.85 kg/m2  SpO2 100%  LMP 03/11/2013      Fetal tracings:  Reassuring x 2      Contractions:   Very occasional      Uterus non-tender      Extremities: no significant edema and no signs of DVT          Labs: None       Meds: None  A: 6452w6d with mono/mono twins     Nausea today     Stable  P: Continue current plan of care      Zofran 4 mg po now and q 8 prn      Upcoming tests/treatments:  US 09/24/13      C/S planned 10/21/13           MDs will follow          Will observe for any worsening of nausea  Shalika Arntz CNM, [place] 09/22/2013 11:18 AM

## 2013-09-23 LAB — GLUCOSE TOLERANCE, 1 HOUR: Glucose, 1 Hour GTT: 140 mg/dL (ref 70–140)

## 2013-09-23 NOTE — Progress Notes (Signed)
Pt up to the BR. 

## 2013-09-23 NOTE — Progress Notes (Signed)
Pt sitting up to eat

## 2013-09-23 NOTE — Progress Notes (Signed)
I visited with [name] and her mother.  [name] was able to talk about her frustration and sadness at being here in the hospital and away from her children (who are staying with her mother).   We will continue to follow up with her to give her space to process her feelings and her experiences, but please also page as needs arise, [phone].  Chaplain Katy Kannon Baum 2:54 PM

## 2013-09-23 NOTE — Progress Notes (Signed)
Ur chart review completed.  

## 2013-09-23 NOTE — Progress Notes (Signed)
Pt sitting up in the bed to eat breakfast

## 2013-09-23 NOTE — Progress Notes (Signed)
Pt sitting up in the bed to eat lunch

## 2013-09-23 NOTE — Progress Notes (Signed)
23 y.o. year old female,at 1920w0d gestation.  SUBJECTIVE:  Doing well  OBJECTIVE:  BP 91/44  Pulse 90  Temp(Src) 98.4 F (36.9 C) (Oral)  Resp 18  Ht 5\' 2"  (1.575 m)  Wt 152 lb 4.8 oz (69.083 kg)  BMI 27.85 kg/m2  SpO2 100%  LMP 03/11/2013  Fetal Heart Tones:  Category 1x2  Contractions:          None  Abdomen: Nontender Extremities: No masses or edema  ASSESSMENT:  4320w0d Weeks Pregnancy  Monochorionic and monoamniotic twin gestation  PLAN:  Continue hospital management.  Glucola screen today.  The patient may go outside for 30 minutes each day for a wheelchair ride as long as fetal heart tones are stable and the patient is not contracting.  Leonard SchwartzArthur Vernon Rollin Kotowski, M.D.

## 2013-09-23 NOTE — Progress Notes (Signed)
Pt up to BR

## 2013-09-24 ENCOUNTER — Ambulatory Visit (HOSPITAL_COMMUNITY)
Admit: 2013-09-24 | Discharge: 2013-09-24 | Disposition: A | Discharge status: Home / Self Care | Payer: Medicaid Other | Attending: Obstetrics and Gynecology | Admitting: Obstetrics and Gynecology

## 2013-09-24 NOTE — Progress Notes (Signed)
Patient ID: Lynn PancoastBrittany D Nolan, female   DOB: 05/04/1991, 23 y.o.   MRN: [id number] Pt without complaints.  No leakage of fluid or VB.  Good FM  BP 111/62  Pulse 91  Temp(Src) 97.8 F (36.6 C) (Oral)  Resp 18  Ht 5\' 2"  (1.575 m)  Wt 152 lb 4.8 oz (69.083 kg)  BMI 27.85 kg/m2  SpO2 100%  LMP 03/11/2013  FHTS NST reassuring times two. no contractions  Toco none  Pt in NAD CV RRR Lungs CTAB abd  Gravid soft and NT GU no vb EXt no calf tenderness Results for orders placed during the hospital encounter of 09/06/13 (from the past 72 hour(s))  TYPE AND SCREEN     Status: None   Collection Time    09/21/13 10:40 PM      Result Value Range   ABO/RH(D) O NEG     Antibody Screen POS     Sample Expiration 09/24/2013     Antibody Identification PASSIVELY ACQUIRED ANTI-D     DAT, IgG NEG     Unit Number [id number]     Blood Component Type RED CELLS,LR     Unit division 00     Status of Unit ALLOCATED     Transfusion Status OK TO TRANSFUSE     Crossmatch Result COMPATIBLE     Unit Number [id number]     Blood Component Type RBC LR PHER1     Unit division 00     Status of Unit ALLOCATED     Transfusion Status OK TO TRANSFUSE     Crossmatch Result COMPATIBLE    GLUCOSE TOLERANCE, 1 HOUR     Status: None   Collection Time    09/23/13 11:55 AM      Result Value Range   Glucose, 1 Hour GTT 140  70 - 140 mg/dL    Assessment and Plan 8524w1d  Mono/mono twins US today demonstrated normal fluid.  No growth done.  Will check with MFM and see fi growth needs to be done this weeks Pt scheduled for 3 hr gtt

## 2013-09-25 LAB — TYPE AND SCREEN
ABO/RH(D): O NEG
ANTIBODY SCREEN: POSITIVE
DAT, IGG: NEGATIVE
UNIT DIVISION: 0
Unit division: 0

## 2013-09-25 LAB — WET PREP, GENITAL
Clue Cells Wet Prep HPF POC: NONE SEEN
Trich, Wet Prep: NONE SEEN
Yeast Wet Prep HPF POC: NONE SEEN

## 2013-09-25 NOTE — Progress Notes (Addendum)
Patient ID: Lynn Nolan, female   DOB: 02/02/1991, 23 y.o.   MRN: [id number]  Lynn Nolan is a 23 y.o. G3P2002 at 5955w2d admitted for continuous monitoring.   Subjective: Reports increased vaginal discharge with odor.  + FM x 2, no VB and no LOF.  She denies ctxs.  Objective: BP 102/57  Pulse 95  Temp(Src) 97.9 F (36.6 C) (Oral)  Resp 18  Ht 5\' 2"  (1.575 m)  Wt 71.532 kg (157 lb 11.2 oz)  BMI 28.84 kg/m2  SpO2 100%  LMP 03/11/2013      Physical Exam:  Gen: alert Chest/Lungs: cta bilaterally  Heart/Pulse: RRR  Abdomen: soft, gravid, nontender Uterine fundus: soft, nontender Skin & Color: warm and dry  Ext: negative Homan's b/l, no evidence of DVTs, no calf tenderness  FHT:  Cat 1 x 2 UC:   none SVE:    deferred U/S yest with nl fluid and vtx/transverse  Labs: Lab Results  Component Value Date   WBC 12.8* 09/10/2013   HGB 11.6* 09/10/2013   HCT 33.1* 09/10/2013   MCV 94.3 09/10/2013   PLT 161 09/10/2013    Assessment and Plan: has Twin gestation, monochorionic monoamniotic; History of pyelonephritis; History of postpartum depression; History of cesarean delivery, currently pregnant; H/o LGA (large for gestational age) fetus; Rh negative status during pregnancy; and Spina bifida, child of prior pregnancy, currently pregnant on her problem list.  Continous monitoring Fetal status is overall reassuring x 2 Cont current mgmt Undergoing glucose load for 3hr GTT on Sat Wet prep sent by RN and returned neg U/s for growth 10/08/13  [name] Y 09/25/2013, 5:30 PM

## 2013-09-26 NOTE — Progress Notes (Signed)
Patient ID: Lynn Nolan, female   DOB: 07/08/1991, 23 y.o.   MRN: [id number]  Lynn Nolan is a 23 y.o. G3P2002 at 9928w3d  Subjective: Pt sleeping when I went to see her early morning  Objective: BP 106/61  Pulse 96  Temp(Src) 97.9 F (36.6 C) (Oral)  Resp 20  Ht 5\' 2"  (1.575 m)  Wt 71.532 kg (157 lb 11.2 oz)  BMI 28.84 kg/m2  SpO2 100%  LMP 03/11/2013      Physical Exam:  Gen: alert Chest/Lungs: cta bilaterally  Heart/Pulse: RRR  Abdomen: soft, gravid, nontender Uterine fundus: soft, nontender Skin & Color: warm and dry  EXT: negative Homan's b/l, no calf tenderness  FHT:  Cat 1  UC:   rare SVE:    deferred  Labs: Lab Results  Component Value Date   WBC 12.8* 09/10/2013   HGB 11.6* 09/10/2013   HCT 33.1* 09/10/2013   MCV 94.3 09/10/2013   PLT 161 09/10/2013    Assessment and Plan: has Twin gestation, monochorionic monoamniotic; History of pyelonephritis; History of postpartum depression; History of cesarean delivery, currently pregnant; H/o LGA (large for gestational age) fetus; Rh negative status during pregnancy; and Spina bifida, child of prior pregnancy, currently pregnant on her problem list.  Continous monitoring  Fetal status is overall reassuring x 2  Cont current mgmt  Undergoing glucose load for 3hr GTT on Sat  U/s for growth 10/08/13   Dontrey Snellgrove Y 09/26/2013, 4:08 PM

## 2013-09-26 NOTE — Progress Notes (Signed)
RN at the bedside 

## 2013-09-26 NOTE — Progress Notes (Signed)
Pt up to the BR. 

## 2013-09-26 NOTE — Progress Notes (Signed)
Back to the BR

## 2013-09-26 NOTE — Progress Notes (Signed)
Pt sitting up in the bed eating breakfast/lunch

## 2013-09-26 NOTE — Progress Notes (Signed)
Pt still sitting up in the bed eating lunch

## 2013-09-26 NOTE — Progress Notes (Signed)
Pt having a birthday party in the room for her 22 year old son, pt moving a lot in the bed

## 2013-09-27 MED ORDER — GUAIFENESIN-DM 100-10 MG/5ML PO SYRP
5.0000 mL | ORAL_SOLUTION | ORAL | Status: DC | PRN
  Administered 2013-09-27 – 2013-09-30 (×5): 5 mL via ORAL
  Filled 2013-09-27 (×4): qty 5

## 2013-09-27 NOTE — Progress Notes (Signed)
Pt sitting up in the bed to eat dinner 

## 2013-09-27 NOTE — Progress Notes (Addendum)
In to see patient.  Asleep in bed.  Denies any issues.  Will return when patient awake.   FHR tracing reassuring x2. No significant ctx.  Nigel BridgemanVicki Ledarrius Beauchaine, CNM 11:27am

## 2013-09-28 LAB — GLUCOSE, 3 HOUR GESTATIONAL: Glucose, GTT - 3 Hour: 110 mg/dL (ref 70–144)

## 2013-09-28 LAB — GLUCOSE, 1 HOUR GESTATIONAL: GLUCOSE, 1 HOUR-GESTATIONAL: 125 mg/dL (ref 70–189)

## 2013-09-28 LAB — GLUCOSE, FASTING GESTATIONAL: GLUCOSE, FASTING-GESTATIONAL: 99 mg/dL

## 2013-09-28 LAB — GLUCOSE, 2 HOUR GESTATIONAL: Glucose Tolerance, 2 hour: 142 mg/dL (ref 70–164)

## 2013-09-28 NOTE — Progress Notes (Signed)
28 wks 5 days with mono/ mono twin pregnancy  Subjective: Pt without  Complaints. + FM no lof no vb no  contrations   Objective: Vital signs in last 24 hours: Temp:  [97.6 F (36.4 C)-98.5 F (36.9 C)] 98.3 F (36.8 C) (01/17 1301) Pulse Rate:  [94-101] 101 (01/17 1301) Resp:  [18-20] 20 (01/17 1301) BP: (76-110)/(39-63) 76/39 mmHg (01/17 1301)    Fetus A FHR category 1...  Fetus B FHR category 2  Toco no contractions  Intake/Output from previous day:   Intake/Output this shift:    General appearance: alert, cooperative and no distress GI: gravid nontender  Extremities: no edema   Lab Results:  No results found for this basename: WBC, HGB, HCT, PLT,  in the last 72 hours BMET No results found for this basename: NA, K, CL, CO2, GLUCOSE, BUN, CREATININE, CALCIUM,  in the last 72 hours PT/INR No results found for this basename: LABPROT, INR,  in the last 72 hours ABG No results found for this basename: PHART, PCO2, PO2, HCO3,  in the last 72 hours  Studies/Results: No results found.  Anti-infectives: Anti-infectives   None      Assessment/Plan: 28 wks 5 days has Twin gestation, monochorionic monoamniotic; History of pyelonephritis; History of postpartum depression; History of cesarean delivery, currently pregnant; H/o LGA (large for gestational age) fetus; Rh negative status during pregnancy; and Spina bifida, child of prior pregnancy, currently pregnant on her problem list.  continue fetal monitoring.  Negative 3 hr gtt today U/S plan 10/08/2013 for growth  Delivery planned at 32 wks.    LOS: 22 days    Lynn Nolan J. 09/28/2013

## 2013-09-28 NOTE — Progress Notes (Signed)
Pt. Sitting to eat regular diet. Patient informed to let nurse know when she is finished so fetal monitoring can be resumed.

## 2013-09-28 NOTE — Progress Notes (Signed)
Pt sitting up to eat. Will let nurse know when she is finished and monitoring can be resumed. Patient visiting with family at this time as well.

## 2013-09-29 LAB — TYPE AND SCREEN
ABO/RH(D): O NEG
ANTIBODY SCREEN: POSITIVE
DAT, IGG: NEGATIVE
UNIT DIVISION: 0
Unit division: 0

## 2013-09-29 NOTE — Progress Notes (Signed)
28 wks 6 days with mono/ mono twin pregnancy  Subjective: Pt resting comfortably, no acute complaints. + FMx2 no lof no vb no  contrations   Objective: Vital signs in last 24 hours: Temp:  [98.1 F (36.7 C)-98.5 F (36.9 C)] 98.5 F (36.9 C) (01/17 1936) Pulse Rate:  [88-101] 88 (01/17 1936) Resp:  [18-20] 18 (01/17 1936) BP: (76-106)/(39-58) 94/58 mmHg (01/17 1936)   Difficulty monitoring, overall FHT reassuring  Fetus A FHR 140, moderate variability, +Accels, no decels, category 1. Fetus B FHR category 2 - 120, moderate variability, difficulty with tracing, occasional variable decel SVE: deferred Intake/Output from previous day:   Intake/Output this shift:   General appearance: alert, cooperative and no distress GI: gravid nontender  Extremities: no edema    Assessment/Plan: 28 wks 6 days has Twin gestation, monochorionic monoamniotic; History of pyelonephritis; History of postpartum depression; History of cesarean delivery, currently pregnant; H/o LGA (large for gestational age) fetus; Rh negative status during pregnancy; and Spina bifida, child of prior pregnancy, currently pregnant on her problem list.  Continue fetal monitoring, overall FHT reassuring Negative 3 hr gtt U/S plan 10/08/2013 for growth  Delivery planned at 32 wks.    LOS: 23 days    Sharon SellerOZAN, Vernisha Bacote, M 09/29/2013

## 2013-09-29 NOTE — Progress Notes (Signed)
Patient in bathroom, tearful. Very upset with boyfriend. Emotional support provided.

## 2013-09-30 MED ORDER — BETAMETHASONE SOD PHOS & ACET 6 (3-3) MG/ML IJ SUSP
12.0000 mg | INTRAMUSCULAR | Status: AC
  Administered 2013-10-07 – 2013-10-08 (×2): 12 mg via INTRAMUSCULAR
  Filled 2013-09-30 (×2): qty 2

## 2013-09-30 MED ORDER — PANTOPRAZOLE SODIUM 40 MG PO TBEC
40.0000 mg | DELAYED_RELEASE_TABLET | Freq: Every day | ORAL | Status: DC
  Administered 2013-09-30 – 2013-10-23 (×22): 40 mg via ORAL
  Filled 2013-09-30 (×24): qty 1

## 2013-09-30 NOTE — Progress Notes (Signed)
Ur chart review completed.  

## 2013-09-30 NOTE — Progress Notes (Signed)
Hospital day # 24 pregnancy at 2183w0d mono-mono twins  S: well, reports good fetal activity      Contractions:none      Vaginal bleeding:none now       Vaginal discharge: no significant change      Complains of constant heartburn  O: BP 100/58  Pulse 86  Temp(Src) 98.5 F (36.9 C) (Oral)  Resp 18  Ht 5\' 2"  (1.575 m)  Wt 157 lb 11.2 oz (71.532 kg)  BMI 28.84 kg/m2  SpO2 100%  LMP 03/11/2013      Fetal tracings:reviewed and reassuring with occasional variables      Uterus gravid and non-tender      Extremities: no significant edema and no signs of DVT  A: 5883w0d with mono-mono twin pregnancy here for continuous monitoring     stable  P: continue current plan of care     Repeat BMZ at 30 weeks     Cesarean delivery scheduled 10/21/13 at 32 weeks     Adding Protonix today  Durene Dodge A  MD 09/30/2013 1:29 PM

## 2013-09-30 NOTE — Progress Notes (Signed)
Pt just out of shower at 0253

## 2013-09-30 NOTE — Progress Notes (Signed)
PT NOTE Reviewed chart. No apparent changes in status.  Patient scheduled for c-section on 10/21/13.  Will continue to monitor for needs and/or changes.  Thank you, 09/30/2013 Corlis HoveMargie Zivah Mayr, PT [phone]

## 2013-09-30 NOTE — Progress Notes (Signed)
Pt and FOB showering together.  Door locked.

## 2013-10-01 NOTE — Progress Notes (Signed)
Patient ID: Lynn Nolan, female   DOB: 12/02/1990, 23 y.o.   MRN: [id number] Pt without complaints.  No leakage of fluid or VB.  Good FM  BP 82/34  Pulse 79  Temp(Src) 98.6 F (37 C) (Oral)  Resp 18  Ht 5\' 2"  (1.575 m)  Wt 157 lb 11.2 oz (71.532 kg)  BMI 28.84 kg/m2  SpO2 100%  LMP 03/11/2013  FHTS reassuring fetal heart tones times two  Toco none  Pt in NAD CV RRR Lungs CTAB abd  Gravid soft and NT GU no vb EXt no calf tenderness Results for orders placed during the hospital encounter of 09/06/13 (from the past 72 hour(s))  TYPE AND SCREEN     Status: None   Collection Time    10/01/13  5:20 AM      Result Value Range   ABO/RH(D) O NEG     Antibody Screen POS     Sample Expiration 10/04/2013     Antibody Identification PASSIVELY ACQUIRED ANTI-D     DAT, IgG NEG     Unit Number [id number]     Blood Component Type RED CELLS,LR     Unit division 00     Status of Unit ALLOCATED     Transfusion Status OK TO TRANSFUSE     Crossmatch Result COMPATIBLE     Unit Number [id number]     Blood Component Type RED CELLS,LR     Unit division 00     Status of Unit ALLOCATED     Transfusion Status OK TO TRANSFUSE     Crossmatch Result COMPATIBLE      Assessment and Plan 123w1d  MMT BMZ @30  weeks Continue current care

## 2013-10-01 NOTE — Progress Notes (Signed)
Pt. Sitting up to eat; will readjust when she's finished

## 2013-10-01 NOTE — Progress Notes (Signed)
Pt still in bathroom with FOB with door locked. Pt states she is ok.

## 2013-10-01 NOTE — Progress Notes (Signed)
Pt. Sitting up eating.

## 2013-10-01 NOTE — Progress Notes (Signed)
Pt. Changed position in bed and is on her left lateral side; difficult to trace both babies

## 2013-10-01 NOTE — Progress Notes (Signed)
Monitors removed by pt. Pt up in bathroom with FOB with door locked.

## 2013-10-02 NOTE — Progress Notes (Signed)
Pt. Finished eating dinner; readjusted monitors

## 2013-10-02 NOTE — Progress Notes (Signed)
Patient ID: Lynn Nolan, female   DOB: 06/19/1991, 23 y.o.   MRN: [id number] Pt without complaints.  No leakage of fluid or VB.  Good FM  BP 97/51  Pulse 76  Temp(Src) 98.6 F (37 C) (Oral)  Resp 18  Ht 5\' 2"  (1.575 m)  Wt 159 lb 1.6 oz (72.167 kg)  BMI 29.09 kg/m2  SpO2 100%  LMP 03/11/2013  FHTS reassuring fetal heart tones times two  Toco none  Pt in NAD CV RRR Lungs CTAB abd  Gravid soft and NT GU no vb EXt no calf tenderness Results for orders placed during the hospital encounter of 09/06/13 (from the past 72 hour(s))  TYPE AND SCREEN     Status: None   Collection Time    10/01/13  5:20 AM      Result Value Range   ABO/RH(D) O NEG     Antibody Screen POS     Sample Expiration 10/04/2013     Antibody Identification PASSIVELY ACQUIRED ANTI-D     DAT, IgG NEG     Unit Number [id number]     Blood Component Type RED CELLS,LR     Unit division 00     Status of Unit ALLOCATED     Transfusion Status OK TO TRANSFUSE     Crossmatch Result COMPATIBLE     Unit Number [id number]     Blood Component Type RED CELLS,LR     Unit division 00     Status of Unit ALLOCATED     Transfusion Status OK TO TRANSFUSE     Crossmatch Result COMPATIBLE      Assessment and Plan 9356w1d  MMT BMZ @30  weeks Continue current care

## 2013-10-02 NOTE — Progress Notes (Signed)
Pt. Sitting up eating dinner; will adjust monitor after she eats

## 2013-10-02 NOTE — Progress Notes (Signed)
10/02/13 1600  Clinical Encounter Type  Visited With Patient  Visit Type Spiritual support;Social support  Referral From Nurse  Spiritual Encounters  Spiritual Needs Emotional  Stress Factors  Patient Stress Factors Loss of control;Family relationships   Made follow-up visit to offer further support, reiterating chaplains' availability as part of her care team in the hospital.  Provided opportunity for her to share her story and process her feelings about her pregnancy, family dynamics, missing her boys (ages 2 and 4), recent breakup with FOB, and general lack of support.  She was appreciative of reflective listening.  Spiritual Care will continue to follow, but please also page as needed:  [phone].  Thank you!  [address]Chaplain Zaylin Pistilli NoblestownLunde[place], [place]MD[place] [phone]

## 2013-10-02 NOTE — Progress Notes (Signed)
Pt. Up to BR.

## 2013-10-03 NOTE — Progress Notes (Signed)
Patient remains in shower, no needs.

## 2013-10-03 NOTE — Progress Notes (Signed)
Hospital day # 27 pregnancy at 1719w3d mono-mono twin pregnancy here for continuous fetal monitoring  S: sad today: hospital stay is getting long but understands the necessity      reports good fetal activity      Contractions:1-20 per day with some more intense      Vaginal bleeding:none now       Vaginal discharge: no significant change      GERD improved with Protonix  O: BP 87/43  Pulse 97  Temp(Src) 98.5 F (36.9 C) (Oral)  Resp 18  Ht 5\' 2"  (1.575 m)  Wt 159 lb 1.6 oz (72.167 kg)  BMI 29.09 kg/m2  SpO2 100%  LMP 03/11/2013      Fetal tracings:reviewed and reassuring      Uterus non-tender      Extremities: no significant edema and no signs of DVT  Nolan: 6119w3d with mono-mono twin pregnancy here for continuous fetal monitoring     unchanged  P: continue current plan of care      Repeat BMZ at 30 weeks      C/S scheduled 10/21/13  [facility] Angeles[facility]IV[facility],Lynn Sanden A  MD 10/03/2013 1:04 PM

## 2013-10-03 NOTE — Progress Notes (Signed)
Attempting to adjust FHR. Patient then up to shower.

## 2013-10-03 NOTE — Progress Notes (Signed)
Pt. States she doesn't want her 10:00 med until after she eats. She also wants to wait to rotate saline lock site. She wants to speak to the doctor first.

## 2013-10-04 ENCOUNTER — Other Ambulatory Visit: Payer: Self-pay | Admitting: Obstetrics and Gynecology

## 2013-10-04 LAB — TYPE AND SCREEN
ABO/RH(D): O NEG
ANTIBODY SCREEN: POSITIVE
DAT, IgG: NEGATIVE
UNIT DIVISION: 0
Unit division: 0

## 2013-10-04 NOTE — Progress Notes (Signed)
Pt. States she will order lunch soon.

## 2013-10-04 NOTE — Progress Notes (Signed)
At bedside trying to get FHR consistently. Able to hear increased movement with FHR intermittent and adjusting. Another RN asked to come to bedside to attempt ability to obtain consistent FHR tracing.

## 2013-10-04 NOTE — Progress Notes (Signed)
Multiple readjustments. 2 RNs adjusting to attempt to maintain continuous tracing.

## 2013-10-04 NOTE — Progress Notes (Addendum)
Came to see patient.  Patient sleeping soundly.  Tracings reviewed--Cat I tracing.  Will follow up when patient awakes.   Nigel BridgemanVicki Berdell Hostetler, CNM 10/04/2013 [id number] AM  Addendum:  Hospital day # 29 pregnancy at 6426w5d--Mono/mono twins.  Now awake.  S:  Doing well, reports good fetal activity      Perception of contractions: none      Vaginal bleeding: None       Vaginal discharge:  None  O: BP 94/49  Pulse 98  Temp(Src) 98.5 F (36.9 C) (Oral)  Resp 18  Ht 5\' 2"  (1.575 m)  Wt 159 lb 1.6 oz (72.167 kg)  BMI 29.09 kg/m2  SpO2 98%  LMP 03/11/2013      Fetal tracings:  Category 1 x  2      Contractions:   Occasional, mild      Uterus non-tender      Extremities: no significant edema and no signs of DVT          Labs:  NA  A: 1626w5d with mono/mono twins     Stable  P: Continue current plan of care      Upcoming tests/treatments:       Repeat betamethasone course at 30 weeks      Repeat US for growth on 10/08/13      Repeat C/S planned 10/21/13      MDs will follow  Nigel BridgemanLATHAM, Dayyan Krist CNM, [place] 10/04/13 5p

## 2013-10-05 MED ORDER — LACTATED RINGERS IV SOLN
INTRAVENOUS | Status: DC
  Administered 2013-10-05 – 2013-10-08 (×7): via INTRAVENOUS

## 2013-10-05 MED ORDER — NIFEDIPINE 10 MG PO CAPS
10.0000 mg | ORAL_CAPSULE | Freq: Once | ORAL | Status: AC
  Administered 2013-10-05: 10 mg via ORAL
  Filled 2013-10-05: qty 1

## 2013-10-05 MED ORDER — LACTATED RINGERS IV BOLUS (SEPSIS)
500.0000 mL | Freq: Once | INTRAVENOUS | Status: AC
  Administered 2013-10-05: 1000 mL via INTRAVENOUS

## 2013-10-05 NOTE — Progress Notes (Addendum)
Hospital day # 29 pregnancy at 7051w5d--Mono/mono twins  S:  Sleeping at present--generally sleeps till late morning.  O: BP 94/49  Pulse 98  Temp(Src) 98.5 F (36.9 C) (Oral)  Resp 18  Ht 5\' 2"  (1.575 m)  Wt 159 lb 1.6 oz (72.167 kg)  BMI 29.09 kg/m2  SpO2 98%  LMP 03/11/2013      Fetal tracings:  Category 1 x 2      Contractions:   Occasional, mild         A: 5051w5d with mono/mono twins     Stable  P: Continue current plan of care     Upcoming tests/treatments:   Repeat betamethasone course at 30 weeks   Repeat US for growth on 10/08/13   Repeat C/S planned 10/21/13      MDs will follow  LATHAM, VICKI CNM, [place] 10/05/2013 8:35 AM   Called because pt starting to have regular ctxs and rating them about 4-5/10.  Pt admits to having unprotected IC last night so FFN not done.  VE closed/50%.  IVFs started and procardia given with improvement.  Pt instructed to avoid IC.  She is agreeable.  Fetal status is overall reassuring x 2.

## 2013-10-06 NOTE — Progress Notes (Addendum)
Hospital day # 30 pregnancy at 1354w6d--Mono/mono twins here for twin observation  S: Sleeping at present--RN will call as soon as she's awake   O:  Filed Vitals:   10/05/13 1208 10/05/13 1520 10/05/13 1945 10/06/13 0816  BP:  104/51 105/64 100/54  Pulse:  99 89 65  Temp: 98.1 F (36.7 C)  98.5 F (36.9 C) 98.3 F (36.8 C)  TempSrc: Oral  Oral Oral  Resp: 18  18 20   Height:      Weight:      SpO2:       Fetal tracings: reassuring x 2  Contractions: Not tracing currently, none reported by pt per RN  A: 4054w6d with mono/mono twins  Stable   P: Will return for rounding MD to follow   Tracings reviewed Status quo In bathroom at present

## 2013-10-07 LAB — TYPE AND SCREEN
ABO/RH(D): O NEG
ANTIBODY SCREEN: POSITIVE
DAT, IGG: NEGATIVE
UNIT DIVISION: 0
Unit division: 0

## 2013-10-07 MED ORDER — MAGNESIUM SULFATE 40 G IN LACTATED RINGERS - SIMPLE
2.5000 g/h | INTRAVENOUS | Status: DC
  Administered 2013-10-08: 2.5 g/h via INTRAVENOUS
  Filled 2013-10-07 (×2): qty 500

## 2013-10-07 MED ORDER — NIFEDIPINE 10 MG PO CAPS
20.0000 mg | ORAL_CAPSULE | Freq: Once | ORAL | Status: AC
  Administered 2013-10-07: 20 mg via ORAL
  Filled 2013-10-07: qty 2

## 2013-10-07 MED ORDER — FENTANYL CITRATE 0.05 MG/ML IJ SOLN
100.0000 ug | Freq: Once | INTRAMUSCULAR | Status: AC
  Administered 2013-10-07: 100 ug via INTRAVENOUS
  Filled 2013-10-07: qty 2

## 2013-10-07 MED ORDER — MAGNESIUM SULFATE BOLUS VIA INFUSION
4.0000 g | Freq: Once | INTRAVENOUS | Status: AC
  Administered 2013-10-07: 4 g via INTRAVENOUS

## 2013-10-07 MED ORDER — PROMETHAZINE HCL 25 MG/ML IJ SOLN
12.5000 mg | Freq: Once | INTRAMUSCULAR | Status: AC
  Administered 2013-10-07: 12.5 mg via INTRAVENOUS
  Filled 2013-10-07: qty 1

## 2013-10-07 MED ORDER — MAGNESIUM SULFATE 40 MG/ML IJ SOLN
4.0000 g | Freq: Once | INTRAMUSCULAR | Status: DC
  Filled 2013-10-07: qty 100

## 2013-10-07 MED ORDER — LACTATED RINGERS IV BOLUS (SEPSIS)
500.0000 mL | Freq: Once | INTRAVENOUS | Status: AC
  Administered 2013-10-07: 500 mL via INTRAVENOUS

## 2013-10-07 NOTE — Progress Notes (Signed)
Patient feeling much better but sleepy Lynn Nolan have spaced to 2-3 per hour on MgSO4 at 2.5 g/hr DTR: 1+ Maintain same plan of care

## 2013-10-07 NOTE — Progress Notes (Signed)
Hospital day # 31 pregnancy at 937w0d with mono-mono twins for continuous fetal monitoring  S: well, reports good fetal activity      Contractions: called at 9:00 for new onset of contractions. Patient uncomfortable with low abdominal pain and back pain. Frequency is every 2 minutes despite IV fluids and Procardia 20 mg po.      Vaginal bleeding:none now       Vaginal discharge: no significant change  O: BP 117/70  Pulse 102  Temp(Src) 98.5 F (36.9 C) (Oral)  Resp 18  Ht 5\' 2"  (1.575 m)  Wt 159 lb 1.6 oz (72.167 kg)  BMI 29.09 kg/m2  SpO2 98%  LMP 03/11/2013      Fetal tracings:reviewed and reassuring. Negative CST      Uterus gravid and non-tender      Extremities: no significant edema and no signs of DVT      VE: long / closed/ Ballottable  A: 6337w0d with mono-mono twins with reassuring testing but persistent contractions     BMZ received at 00:01   P:  Recommend MGSO4 to stop contractions and gain 24 hours on 2nd course of BMZ. If labor follows, will then proceed with cesarean section as planned with MGSO4 already on board for neuroprotection.  Pt is agreeable to proceed and understands she needs to remain NPO for now except for water.  Eleonore Shippee A  MD 10/07/2013 9:15 AM

## 2013-10-07 NOTE — Progress Notes (Signed)
Ur chart review completed.  

## 2013-10-07 NOTE — Progress Notes (Signed)
Called to check on patient status. Sleeping at present. Had denied any issues with magnesium at 2.5 gm/hour.  FHRs Reassuring x 2 UCs irritability, with 1-2 UCs per hour  Will maintain magnesium at 2.5 gm/hour at present, since patient is tolerating dose without difficulty. Will defer any decrease in dose until tomorrow, unless patient becomes symptomatic on magnesium.  Nigel BridgemanVicki Delainee Tramel, CNM 10/07/13 9:30p

## 2013-10-08 ENCOUNTER — Ambulatory Visit (HOSPITAL_COMMUNITY)
Admit: 2013-10-08 | Discharge: 2013-10-08 | Disposition: A | Discharge status: Home / Self Care | Payer: Medicaid Other | Attending: Obstetrics and Gynecology | Admitting: Obstetrics and Gynecology

## 2013-10-08 NOTE — Progress Notes (Signed)
Off monitors at this time. Ultrasound in progress.

## 2013-10-08 NOTE — Progress Notes (Signed)
Hospital day # 32 pregnancy at 7166w1d with mono-mono twins for inpatient fetal monitoring now 24 hours of MgSO4  S: well, reports good fetal activity      Contractions:none      Vaginal bleeding:none now       Vaginal discharge: no significant change  O: BP 96/78  Pulse 95  Temp(Src) 98.3 F (36.8 C) (Oral)  Resp 18  Ht 5\' 2"  (1.575 m)  Wt 159 lb 1.6 oz (72.167 kg)  BMI 29.09 kg/m2  SpO2 98%  LMP 03/11/2013      Fetal tracings:reviewed and reassuring      Uterus gravid and non-tender      Extremities: no significant edema and no signs of DVT  A: 3566w1d with mono-mono twin gestation with resolved PTL on MgSO4 and completed 2nd course of BMZ     stable  P: D/C magnesium sulfate Pt may shower Reviewed desire for BTL with her young age and high risk of regret. Discussed other options: Mirena ( previous user with expulsion at 1 year), Nexplanon and Depo. Patient will think about it.Should she decide to proceed with BTL, I recommend Filschie clips and patient is agreeable.  [id number],JYNWGNRIVARD,Jozey Janco A  MD 10/08/2013 4:29 PM

## 2013-10-09 NOTE — Progress Notes (Signed)
Patient ID: Lynn PancoastBrittany D Blinder, female   DOB: 04/03/1991, 23 y.o.   MRN: [id number] Lynn Nolan is a 23 y.o. G3P2002 at 282w2d admitted for fetal monitoring.  Subjective: No complaints.  Denies ctxs, abnl discharge, lof and reports good FM.  Objective: BP 118/68  Pulse 81  Temp(Src) 98.4 F (36.9 C) (Oral)  Resp 18  Ht 5\' 2"  (1.575 m)  Wt 74.118 kg (163 lb 6.4 oz)  BMI 29.88 kg/m2  SpO2 98%  LMP 03/11/2013 I/O last 3 completed shifts: In: 2914.5 [P.O.:1602; I.V.:1312.5] Out: 4100 [Urine:4100]    Physical Exam:  Gen: alert Chest/Lungs: cta bilaterally  Heart/Pulse: RRR  Abdomen: soft, gravid, nontender, BX x4 quad Uterine fundus: soft, nontender EXT: nno calf tenderness  FHT:  Cat 1 x 2 UC:   Rare  SVE:   Dilation: Closed Station: Ballotable Exam by:: Dr. Estanislado Pandyivard  Labs: Lab Results  Component Value Date   WBC 12.8* 09/10/2013   HGB 11.6* 09/10/2013   HCT 33.1* 09/10/2013   MCV 94.3 09/10/2013   PLT 161 09/10/2013    Assessment and Plan: has Twin gestation, monochorionic monoamniotic; History of pyelonephritis; History of postpartum depression; History of cesarean delivery, currently pregnant; H/o LGA (large for gestational age) fetus; Rh negative status during pregnancy; and Spina bifida, child of prior pregnancy, currently pregnant on her problem list.  Cont current care Plan c/s at 32wks  Purcell NailsROBERTS,Angelyna Henderson Y 10/09/2013, 7:20 PM

## 2013-10-09 NOTE — Progress Notes (Signed)
3 ctx. In the last 1/2 hour. Pt. Denies feeling any discomfort. States she feels tightening. Will continue to monitor closely.

## 2013-10-10 LAB — TYPE AND SCREEN
ABO/RH(D): O NEG
Antibody Screen: POSITIVE
DAT, IgG: NEGATIVE
UNIT DIVISION: 0

## 2013-10-10 NOTE — Progress Notes (Signed)
Patient ID: Lynn Nolan, female   DOB: 12/24/1990, 23 y.o.   MRN: [id number] Lynn Nolan is a 23 y.o. G3P2002 at 4340w3d admitted for fetal monitoring.  Subjective: No complaints.  Denies ctxs, abnl discharge, lof and reports good FM.  Objective: BP 107/68  Pulse 81  Temp(Src) 98.2 F (36.8 C) (Oral)  Resp 18  Ht 5\' 2"  (1.575 m)  Wt 163 lb 6.4 oz (74.118 kg)  BMI 29.88 kg/m2  SpO2 98%  LMP 03/11/2013      Physical Exam:  Gen: alert Chest/Lungs: cta bilaterally  Heart/Pulse: RRR  Abdomen: soft, gravid, nontender, BX x4 quad Uterine fundus: soft, nontender EXT: nno calf tenderness  FHT:  Reassuring times two UC:   4-6/hr SVE:   Dilation: Closed Station: Ballotable Exam by:: Dr. Estanislado Pandyivard  Labs: Lab Results  Component Value Date   WBC 12.8* 09/10/2013   HGB 11.6* 09/10/2013   HCT 33.1* 09/10/2013   MCV 94.3 09/10/2013   PLT 161 09/10/2013    Assessment and Plan: has Twin gestation, monochorionic monoamniotic; History of pyelonephritis; History of postpartum depression; History of cesarean delivery, currently pregnant; H/o LGA (large for gestational age) fetus; Rh negative status during pregnancy; and Spina bifida, child of prior pregnancy, currently pregnant on her problem list.  Cont current care Plan c/s at 32wks Pt is unsure about the tubal ligation.    Lynn Nolan A 10/10/2013, 11:45 AM

## 2013-10-11 NOTE — Progress Notes (Signed)
23 y.o. year old female,at 2254w4d gestation.  SUBJECTIVE:  No complaints.  OBJECTIVE:  BP 107/61  Pulse 96  Temp(Src) 98.9 F (37.2 C) (Oral)  Resp 24  Ht 5\' 2"  (1.575 m)  Wt 163 lb 6.4 oz (74.118 kg)  BMI 29.88 kg/m2  SpO2 98%  LMP 03/11/2013  Fetal Heart Tones:  Cat 1 x 2  Contractions:          none  Abd: Soft and nontender  ASSESSMENT:  3554w4d Weeks Pregnancy  Mono-mono twins  PLAN:  Continue in hospital care as recommended.  Leonard SchwartzArthur Vernon Sorayah Schrodt, M.D.

## 2013-10-11 NOTE — Progress Notes (Signed)
Pt. Sitting up to eat lunch 

## 2013-10-11 NOTE — Progress Notes (Signed)
Pt. Sitting up eating dinner; will adjust monitors when finished

## 2013-10-11 NOTE — Progress Notes (Signed)
Pt. Up to BR.

## 2013-10-11 NOTE — Progress Notes (Signed)
Pt. Still eating dinner; will adjust monitors when finshed

## 2013-10-11 NOTE — Progress Notes (Signed)
Pt. Up to BR; she states she is okay; off monitors

## 2013-10-12 LAB — TYPE AND SCREEN
ABO/RH(D): O NEG
ANTIBODY SCREEN: POSITIVE
DAT, IgG: NEGATIVE
Unit division: 0

## 2013-10-12 NOTE — Progress Notes (Signed)
MD made aware of pt having cxt's every 7-10 mins.  No new orders given at this time, will continue to watch and monitor. Alta CorningWhitfield, Corda Shutt M, RN

## 2013-10-13 NOTE — Progress Notes (Signed)
23 y.o. year old female,at 4046w6d gestation.  SUBJECTIVE:   no complaints  OBJECTIVE:  BP 95/49  Pulse 106  Temp(Src) 98.5 F (36.9 C) (Oral)  Resp 24  Ht 5\' 2"  (1.575 m)  Wt 163 lb 6.4 oz (74.118 kg)  BMI 29.88 kg/m2  SpO2 98%  LMP 03/11/2013  Fetal Heart Tones:  Category 1x2  Contractions:          Irregular  Abdomen soft and nontender  ASSESSMENT:  7346w6d Weeks Pregnancy  Mono-mono twins  PLAN:  Continue in-hospital observation.  Delivery at [redacted] weeks gestation her maternal fetal medicine recommendations.  Leonard SchwartzArthur Vernon Shanta Hartner, M.D.

## 2013-10-13 NOTE — Progress Notes (Signed)
@   0145 Pt out of shower and stated that she was getting dress.  At this time back in bathroom with FOB.

## 2013-10-13 NOTE — Progress Notes (Deleted)
Patient has been in the bathroom since 1216 with significant other and is aware of need for monitoring.

## 2013-10-13 NOTE — Progress Notes (Signed)
Pt. Took self off of fetal monitoring at 12:29 and has been in the bathroom with significant other since then. Pt is aware of need to monitor both fetuses for fetal well being.

## 2013-10-13 NOTE — Progress Notes (Signed)
Patient has been in the bathroom since 1416 and is aware of need for monitoring of twins.

## 2013-10-13 NOTE — Progress Notes (Signed)
Off of fetal monitor @23 :36 to shower.  Pt arguing with FOB 0050.  At 0100 pt  In shower with FOB.  Denies any problems.

## 2013-10-13 NOTE — Progress Notes (Signed)
Attempting to relocated both fetal heart tones.

## 2013-10-14 NOTE — Progress Notes (Signed)
Pt sitting on side of bed. Pt has second dinner tray delivered. Pt instructed to notify me when I can place EFM. Pt reminded that she has been off EFM since right before 2000 and needs to go back on monitor.

## 2013-10-14 NOTE — Progress Notes (Signed)
Piercing removed from below lip by L. Clelia CroftShaw RN. No difficulties with removal. Site unremarkable.

## 2013-10-14 NOTE — Progress Notes (Signed)
Pt in bathroom with FOB.

## 2013-10-14 NOTE — Progress Notes (Signed)
Patient ID: Lynn Nolan, female   DOB: 08/31/1991, 23 y.o.   MRN: [id number] Lynn Nolan is a 23 y.o. G3P2002 at 4051w3d admitted for fetal monitoring.  Subjective: No complaints.  Denies ctxs, abnl discharge, lof and reports good FM.  Objective: BP 100/62  Pulse 102  Temp(Src) 98.2 F (36.8 C) (Oral)  Resp 24  Ht 5\' 2"  (1.575 m)  Wt 163 lb 6.4 oz (74.118 kg)  BMI 29.88 kg/m2  SpO2 96%  LMP 03/11/2013      Physical Exam:  Gen: alert Chest/Lungs: cta bilaterally  Heart/Pulse: RRR  Abdomen: soft, gravid, nontender, BX x4 quad Uterine fundus: soft, nontender EXT: nno calf tenderness  FHT:  Reassuring times two UC:   irreg SVE:   Dilation: Closed Station: Ballotable Exam by:: Dr. Estanislado Nolan  Labs: Lab Results  Component Value Date   WBC 12.8* 09/10/2013   HGB 11.6* 09/10/2013   HCT 33.1* 09/10/2013   MCV 94.3 09/10/2013   PLT 161 09/10/2013    Assessment and Plan: Mono mono twins Cont current care Plan c/s at 32wks Pt is unsure about the tubal ligation.    Lynn Nolan A 10/14/2013, 3:47 PM

## 2013-10-14 NOTE — Progress Notes (Signed)
Ur chart review completed.  

## 2013-10-15 LAB — TYPE AND SCREEN
ABO/RH(D): O NEG
ANTIBODY SCREEN: POSITIVE
DAT, IgG: NEGATIVE
UNIT DIVISION: 0
Unit division: 0

## 2013-10-15 NOTE — Progress Notes (Addendum)
Patient ID: Lynn Nolan, female   DOB: 04/24/1991, 23 y.o.   MRN: [id number] Lynn Nolan is a 23 y.o. G3P2002 at 4530w1d admitted for continuous monitoring with Mono/Mono twins.  Subjective: No complaints.  Denies LOF, VB, change in discharge, ctxs and reports good FM.  Objective: BP 95/44  Pulse 84  Temp(Src) 98.5 F (36.9 C) (Oral)  Resp 18  Ht 5\' 2"  (1.575 m)  Wt 74.118 kg (163 lb 6.4 oz)  BMI 29.88 kg/m2  SpO2 96%  LMP 03/11/2013      Physical Exam:  Gen: alert Abdomen: soft, gravid, nontender Uterine fundus: soft, nontender EXT: negative Homan's b/l, edema neg, no calf tenderness  FHT:  Cat 1 x 2 UC:   infrequent SVE:   Dilation: Closed Station: Ballotable Exam by:: Dr. Estanislado Pandyivard  Labs: Lab Results  Component Value Date   WBC 12.8* 09/10/2013   HGB 11.6* 09/10/2013   HCT 33.1* 09/10/2013   MCV 94.3 09/10/2013   PLT 161 09/10/2013    Assessment and Plan: has Twin gestation, monochorionic monoamniotic; History of pyelonephritis; History of postpartum depression; History of cesarean delivery, currently pregnant; H/o LGA (large for gestational age) fetus; Rh negative status during pregnancy; and Spina bifida, child of prior pregnancy, currently pregnant on her problem list.  Continue monitoring Fetal status x 2 reassuring Plan c/s with possible  filshie clips at  32wks, scheduled with SR  Toma Arts Y 10/15/2013, 1:18 PM

## 2013-10-15 NOTE — Progress Notes (Signed)
10/15/13 1600  Clinical Encounter Type  Visited With Patient  Visit Type Spiritual support;Social support  Spiritual Encounters  Spiritual Needs Emotional   Lynn Nolan was in much better spirits today, as she acknowledged herself; anticipating her c-section and return home to older children definitely helps, she states.  Provided pastoral presence, reflective listening, encouragement, and affirmation.  Will continue to follow, but please also page as needed:  [phone].  Thank you.  [address]Chaplain Uzair Godley ReserveLundeen, [place] DakotaMDiv [phone]

## 2013-10-16 NOTE — Progress Notes (Signed)
23 y.o. year old female,at 4728w2d gestation.  SUBJECTIVE:  Doing well. No abdominal tenderness.  OBJECTIVE:  BP 104/65  Pulse 78  Temp(Src) 98.3 F (36.8 C) (Oral)  Resp 20  Ht 5\' 2"  (1.575 m)  Wt 161 lb 1.6 oz (73.074 kg)  BMI 29.46 kg/m2  SpO2 96%  LMP 03/11/2013  Fetal Heart Tones:  Category 1x2  Contractions:          Irregular  Abdomen soft and nontender  ASSESSMENT:  1828w2d Weeks Pregnancy  Monochorionic and monoamniotic twin gestation  PLAN:  Continue in-hospital care as recommended by maternal fetal medicine. The plan is to deliver by cesarean delivery at 32 weeks.  Leonard SchwartzArthur Vernon Ersa Delaney, M.D.

## 2013-10-16 NOTE — Progress Notes (Signed)
PT NOTE - late entry Continue to monitor patient's status.  Stopped in to speak with patient.  She reports all is going well.  No increase in pain and reports she is performing exercises.  Per chart, plan is still for c/s at 32 weeks.  Will continue to monitor via chart.  Please call if any change in needs.  Thank you, Corlis HoveMargie Jaelen Nolan, PT

## 2013-10-17 NOTE — Progress Notes (Signed)
23 y.o. year old female,at 4327w3d gestation.  SUBJECTIVE:  No complaints.  OBJECTIVE:  BP 107/58  Pulse 95  Temp(Src) 97.8 F (36.6 C) (Oral)  Resp 18  Ht 5\' 2"  (1.575 m)  Wt 161 lb 1.6 oz (73.074 kg)  BMI 29.46 kg/m2  SpO2 96%  LMP 03/11/2013  Fetal Heart Tones:  Category 1 x2  Contractions:          none  Abdomen soft and nontender  ASSESSMENT:  627w3d Weeks Pregnancy  Monochorionic and monoamniotic twin gestation  PLAN:  Continue hospital management.  Cesarean delivery on October 21, 2013.  Leonard SchwartzArthur Vernon Karess Harner, M.D.

## 2013-10-18 NOTE — Progress Notes (Signed)
10/18/13 1600  Clinical Encounter Type  Visited With Patient  Visit Type Follow-up;Spiritual support;Social support  Spiritual Encounters  Spiritual Needs Emotional   Followed up to offer support and encouragement as [name] prepares for c-section Monday.  She was in good spirits, eager to move through her last weekend on antenatal after such a long stay; she also names anxiety about pain management and recovery from procedure, as well as balancing recovery, NICU visits, and care of her two boys ([name], 4; [name], 2) at home.  Provided reflective listening and encouragement.   will continue to follow, but please also page as needs arise:  [phone].  Thank you.  [address]Chaplain Laruen Risser KelloggLundeen, [place] [phone]

## 2013-10-18 NOTE — Progress Notes (Signed)
Patient ID: Lynn Nolan, female   DOB: 12/13/1990, 23 y.o.   MRN: [id number] Lynn PancoastBrittany D Starlin is a 23 y.o. G3P2002 at 5981w4d admitted for monitoring.   Subjective: Reports discomfort slightly beneath rt ankle for last few days.  Objective: BP 108/71  Pulse 120  Temp(Src) 98.3 F (36.8 C) (Oral)  Resp 18  Ht 5\' 2"  (1.575 m)  Wt 73.074 kg (161 lb 1.6 oz)  BMI 29.46 kg/m2  SpO2 96%  LMP 03/11/2013      Physical Exam:  Gen: alert Chest/Lungs: cta bilaterally  Heart/Pulse: RRR  Abdomen: soft, gravid, nontender Uterine fundus: soft, nontender EXT: negative Homan's b/l, no calf tenderness. No swelling and no pain with palpation.  SCDs replaced.  FHT:  Cat 1 UC:   infrequent SVE:   Dilation: Closed Station: Ballotable Exam by:: Dr. Estanislado Pandyivard  Labs: Lab Results  Component Value Date   WBC 12.8* 09/10/2013   HGB 11.6* 09/10/2013   HCT 33.1* 09/10/2013   MCV 94.3 09/10/2013   PLT 161 09/10/2013    Assessment and Plan: has Twin gestation, monochorionic monoamniotic; History of pyelonephritis; History of postpartum depression; History of cesarean delivery, currently pregnant; H/o LGA (large for gestational age) fetus; Rh negative status during pregnancy; and Spina bifida, child of prior pregnancy, currently pregnant on her problem list.  Cont current mgmt Rt ankle discomfort likely positional - no s/sxs of DVT  Evin Loiseau Y 10/18/2013, 11:32 AM

## 2013-10-19 LAB — TYPE AND SCREEN
ABO/RH(D): O NEG
Antibody Screen: POSITIVE
DAT, IgG: NEGATIVE
UNIT DIVISION: 0
UNIT DIVISION: 0

## 2013-10-19 NOTE — Progress Notes (Signed)
Patient ID: Lynn PancoastBrittany D Nolan, female   DOB: 03/21/1991, 23 y.o.   MRN: [id number] Lynn Nolan is a 23 y.o. G3P2002 at 7147w5d admitted for monitoring.   Subjective: Pt sleeping comfortably this am.  No acute complaints.  +FM x 2.  No contractions, VB, LOF.  Objective: BP 110/62  Pulse 97  Temp(Src) 98.5 F (36.9 C) (Oral)  Resp 18  Ht 5\' 2"  (1.575 m)  Wt 73.074 kg (161 lb 1.6 oz)  BMI 29.46 kg/m2  SpO2 96%  LMP 03/11/2013      Physical Exam:  Gen: alert Chest/Lungs: cta bilaterally  Heart/Pulse: RRR  Abdomen: soft, gravid, nontender Uterine fundus: soft, nontender EXT: negative Homan's b/l, no calf tenderness. No swelling and no pain with palpation.   FHT:  Cat 1 x2  UC:   infrequent SVE:   Deferred   Assessment and Plan: 79GN 22 G3P2002 with mo/mo twins @ 1247w5d has Twin gestation, monochorionic monoamniotic; History of pyelonephritis; History of postpartum depression; History of cesarean delivery, currently pregnant; H/o LGA (large for gestational age) fetus; Rh negative status during pregnancy; and Spina bifida, child of prior pregnancy, currently pregnant on her problem list.  Cont current mgmt Plan for primary C-section @ 32wk  [name], [name], M 10/19/2013, 7:51 AM

## 2013-10-20 MED ORDER — GENTAMICIN SULFATE 40 MG/ML IJ SOLN
INTRAVENOUS | Status: DC
  Filled 2013-10-20 (×2): qty 7.5

## 2013-10-20 NOTE — Progress Notes (Signed)
Patient ID: Eugenia PancoastBrittany D Manninen, female   DOB: 08/02/1991, 23 y.o.   MRN: [id number] Eugenia PancoastBrittany D Lizaola is a 23 y.o. G3P2002 at 5377w6d admitted for monitoring.   Subjective: Pt resting comfortably, no acute complaints.  +FM x 2.  Occasional contraction, but nothing regular, no VB, no LOF.  Objective: BP 109/56  Pulse 106  Temp(Src) 98.2 F (36.8 C) (Oral)  Resp 18  Ht 5\' 2"  (1.575 m)  Wt 73.074 kg (161 lb 1.6 oz)  BMI 29.46 kg/m2  SpO2 96%  LMP 03/11/2013      Physical Exam:  Gen: alert Chest/Lungs: cta bilaterally  Heart/Pulse: RRR  Abdomen: soft, gravid, nontender Uterine fundus: soft, nontender EXT: negative Homan's b/l, no calf tenderness. No swelling and no pain with palpation.   FHT:  Cat 1 x2  UC:   infrequent SVE:   Deferred   Assessment and Plan: 89YN [age] G3P2002 with mo/mo twins @ 4377w6d has Twin gestation, monochorionic monoamniotic; History of pyelonephritis; History of postpartum depression; History of cesarean delivery, currently pregnant; H/o LGA (large for gestational age) fetus; Rh negative status during pregnancy; and Spina bifida, child of prior pregnancy, currently pregnant on her problem list. Cont current mgmt Plan for primary C-section @ 32wk, NPO tonight at midnight  [name], Malya Cirillo, M 10/20/2013, 8:35 AM

## 2013-10-21 ENCOUNTER — Encounter (HOSPITAL_COMMUNITY): Payer: Medicaid Other | Admitting: Anesthesiology

## 2013-10-21 ENCOUNTER — Inpatient Hospital Stay (HOSPITAL_COMMUNITY): Payer: Medicaid Other | Admitting: Anesthesiology

## 2013-10-21 ENCOUNTER — Encounter (HOSPITAL_COMMUNITY)
Admission: AD | Disposition: A | Discharge status: Home / Self Care | Payer: Self-pay | Source: Ambulatory Visit | Attending: Obstetrics and Gynecology

## 2013-10-21 ENCOUNTER — Encounter (HOSPITAL_COMMUNITY): Payer: Self-pay | Admitting: Anesthesiology

## 2013-10-21 LAB — TYPE AND SCREEN
ABO/RH(D): O NEG
ANTIBODY SCREEN: POSITIVE
DAT, IgG: NEGATIVE
UNIT DIVISION: 0
Unit division: 0

## 2013-10-21 LAB — CBC
HEMATOCRIT: 35.1 % — AB (ref 36.0–46.0)
Hemoglobin: 12.3 g/dL (ref 12.0–15.0)
MCH: 32.4 pg (ref 26.0–34.0)
MCHC: 35 g/dL (ref 30.0–36.0)
MCV: 92.4 fL (ref 78.0–100.0)
PLATELETS: 158 10*3/uL (ref 150–400)
RBC: 3.8 MIL/uL — ABNORMAL LOW (ref 3.87–5.11)
RDW: 13.4 % (ref 11.5–15.5)
WBC: 9.7 10*3/uL (ref 4.0–10.5)

## 2013-10-21 SURGERY — Surgical Case
Anesthesia: Epidural | Site: Abdomen

## 2013-10-21 MED ORDER — MIDAZOLAM HCL 2 MG/2ML IJ SOLN
INTRAMUSCULAR | Status: AC
  Filled 2013-10-21: qty 2

## 2013-10-21 MED ORDER — BUPIVACAINE HCL (PF) 0.25 % IJ SOLN
INTRAMUSCULAR | Status: DC | PRN
  Administered 2013-10-21: 20 mL

## 2013-10-21 MED ORDER — SIMETHICONE 80 MG PO CHEW
80.0000 mg | CHEWABLE_TABLET | Freq: Three times a day (TID) | ORAL | Status: DC
  Administered 2013-10-21 – 2013-10-23 (×5): 80 mg via ORAL
  Filled 2013-10-21 (×5): qty 1

## 2013-10-21 MED ORDER — SODIUM BICARBONATE 8.4 % IV SOLN
INTRAVENOUS | Status: DC | PRN
  Administered 2013-10-21: 3 mL via EPIDURAL
  Administered 2013-10-21: 2 mL via EPIDURAL
  Administered 2013-10-21: 4 mL via EPIDURAL
  Administered 2013-10-21: 5 mL via EPIDURAL
  Administered 2013-10-21: 10 mL via EPIDURAL

## 2013-10-21 MED ORDER — SIMETHICONE 80 MG PO CHEW: 80.0000 mg | CHEWABLE_TABLET | ORAL | Status: DC | PRN

## 2013-10-21 MED ORDER — SCOPOLAMINE 1 MG/3DAYS TD PT72
MEDICATED_PATCH | TRANSDERMAL | Status: AC
  Administered 2013-10-21: 1.5 mg via TRANSDERMAL
  Filled 2013-10-21: qty 1

## 2013-10-21 MED ORDER — KETAMINE HCL 10 MG/ML IJ SOLN
INTRAMUSCULAR | Status: DC | PRN
  Administered 2013-10-21 (×3): 20 mg via INTRAVENOUS
  Administered 2013-10-21: 40 mg via INTRAVENOUS

## 2013-10-21 MED ORDER — MORPHINE SULFATE 0.5 MG/ML IJ SOLN
INTRAMUSCULAR | Status: AC
  Filled 2013-10-21: qty 10

## 2013-10-21 MED ORDER — OXYCODONE-ACETAMINOPHEN 5-325 MG PO TABS
1.0000 | ORAL_TABLET | ORAL | Status: DC | PRN
  Administered 2013-10-22: 2 via ORAL
  Administered 2013-10-22 (×2): 1 via ORAL
  Administered 2013-10-23: 2 via ORAL
  Administered 2013-10-23: 1 via ORAL
  Filled 2013-10-21: qty 1
  Filled 2013-10-21 (×3): qty 2
  Filled 2013-10-21: qty 1

## 2013-10-21 MED ORDER — ZOLPIDEM TARTRATE 5 MG PO TABS: 5.0000 mg | ORAL_TABLET | Freq: Every evening | ORAL | Status: DC | PRN

## 2013-10-21 MED ORDER — FERROUS SULFATE 325 (65 FE) MG PO TABS
325.0000 mg | ORAL_TABLET | Freq: Two times a day (BID) | ORAL | Status: DC
  Administered 2013-10-22 – 2013-10-23 (×3): 325 mg via ORAL
  Filled 2013-10-21 (×3): qty 1

## 2013-10-21 MED ORDER — NALOXONE HCL 1 MG/ML IJ SOLN
1.0000 ug/kg/h | INTRAVENOUS | Status: DC | PRN
  Filled 2013-10-21: qty 2

## 2013-10-21 MED ORDER — ONDANSETRON HCL 4 MG/2ML IJ SOLN
INTRAMUSCULAR | Status: DC | PRN
  Administered 2013-10-21: 4 mg via INTRAVENOUS

## 2013-10-21 MED ORDER — KETOROLAC TROMETHAMINE 30 MG/ML IJ SOLN
INTRAMUSCULAR | Status: AC
  Administered 2013-10-21: 30 mg via INTRAMUSCULAR
  Filled 2013-10-21: qty 1

## 2013-10-21 MED ORDER — FENTANYL CITRATE 0.05 MG/ML IJ SOLN
INTRAMUSCULAR | Status: AC
  Filled 2013-10-21: qty 2

## 2013-10-21 MED ORDER — DIPHENHYDRAMINE HCL 50 MG/ML IJ SOLN: 25.0000 mg | INTRAMUSCULAR | Status: DC | PRN

## 2013-10-21 MED ORDER — MORPHINE SULFATE (PF) 0.5 MG/ML IJ SOLN
INTRAMUSCULAR | Status: DC | PRN
  Administered 2013-10-21: 2 mg via INTRAVENOUS
  Administered 2013-10-21: 3 mg via EPIDURAL

## 2013-10-21 MED ORDER — BUPIVACAINE HCL (PF) 0.25 % IJ SOLN
INTRAMUSCULAR | Status: AC
  Filled 2013-10-21: qty 30

## 2013-10-21 MED ORDER — MEPERIDINE HCL 25 MG/ML IJ SOLN: 6.2500 mg | INTRAMUSCULAR | Status: DC | PRN

## 2013-10-21 MED ORDER — ONDANSETRON HCL 4 MG PO TABS: 4.0000 mg | ORAL_TABLET | ORAL | Status: DC | PRN

## 2013-10-21 MED ORDER — PROMETHAZINE HCL 25 MG/ML IJ SOLN: 6.2500 mg | INTRAMUSCULAR | Status: DC | PRN

## 2013-10-21 MED ORDER — MENTHOL 3 MG MT LOZG: 1.0000 | LOZENGE | OROMUCOSAL | Status: DC | PRN

## 2013-10-21 MED ORDER — OXYTOCIN 10 UNIT/ML IJ SOLN
INTRAMUSCULAR | Status: AC
  Filled 2013-10-21: qty 4

## 2013-10-21 MED ORDER — METOCLOPRAMIDE HCL 5 MG/ML IJ SOLN: 10.0000 mg | Freq: Three times a day (TID) | INTRAMUSCULAR | Status: DC | PRN

## 2013-10-21 MED ORDER — DIPHENHYDRAMINE HCL 50 MG/ML IJ SOLN: 12.5000 mg | INTRAMUSCULAR | Status: DC | PRN

## 2013-10-21 MED ORDER — ONDANSETRON HCL 4 MG/2ML IJ SOLN
4.0000 mg | INTRAMUSCULAR | Status: DC | PRN
  Administered 2013-10-21: 4 mg via INTRAVENOUS
  Filled 2013-10-21: qty 2

## 2013-10-21 MED ORDER — ONDANSETRON HCL 4 MG/2ML IJ SOLN
INTRAMUSCULAR | Status: AC
  Filled 2013-10-21: qty 2

## 2013-10-21 MED ORDER — MEASLES, MUMPS & RUBELLA VAC ~~LOC~~ INJ: 0.5000 mL | INJECTION | Freq: Once | SUBCUTANEOUS | Status: DC

## 2013-10-21 MED ORDER — CITRIC ACID-SODIUM CITRATE 334-500 MG/5ML PO SOLN
ORAL | Status: AC
  Administered 2013-10-21: 30 mL
  Filled 2013-10-21: qty 15

## 2013-10-21 MED ORDER — CLINDAMYCIN PHOSPHATE 900 MG/50ML IV SOLN
900.0000 mg | Freq: Once | INTRAVENOUS | Status: AC
  Administered 2013-10-21: 900 mg via INTRAVENOUS
  Filled 2013-10-21: qty 50

## 2013-10-21 MED ORDER — DIPHENHYDRAMINE HCL 25 MG PO CAPS
25.0000 mg | ORAL_CAPSULE | Freq: Four times a day (QID) | ORAL | Status: DC | PRN
  Filled 2013-10-21 (×2): qty 1

## 2013-10-21 MED ORDER — DIPHENHYDRAMINE HCL 25 MG PO CAPS
25.0000 mg | ORAL_CAPSULE | ORAL | Status: DC | PRN
  Administered 2013-10-21: 25 mg via ORAL
  Filled 2013-10-21 (×2): qty 1

## 2013-10-21 MED ORDER — LACTATED RINGERS IV SOLN
INTRAVENOUS | Status: DC
  Administered 2013-10-21 – 2013-10-22 (×3): via INTRAVENOUS

## 2013-10-21 MED ORDER — WITCH HAZEL-GLYCERIN EX PADS: 1.0000 "application " | MEDICATED_PAD | CUTANEOUS | Status: DC | PRN

## 2013-10-21 MED ORDER — DIBUCAINE 1 % RE OINT
1.0000 | TOPICAL_OINTMENT | RECTAL | Status: DC | PRN
Start: 2013-10-21 — End: 2013-10-23

## 2013-10-21 MED ORDER — KETAMINE HCL 10 MG/ML IJ SOLN
INTRAMUSCULAR | Status: AC
  Filled 2013-10-21: qty 1

## 2013-10-21 MED ORDER — PHENYLEPHRINE HCL 10 MG/ML IJ SOLN
INTRAMUSCULAR | Status: AC
  Filled 2013-10-21: qty 1

## 2013-10-21 MED ORDER — SENNOSIDES-DOCUSATE SODIUM 8.6-50 MG PO TABS
2.0000 | ORAL_TABLET | ORAL | Status: DC
  Administered 2013-10-21 – 2013-10-23 (×2): 2 via ORAL
  Filled 2013-10-21 (×4): qty 2

## 2013-10-21 MED ORDER — MIDAZOLAM HCL 2 MG/2ML IJ SOLN: 0.5000 mg | Freq: Once | INTRAMUSCULAR | Status: DC | PRN

## 2013-10-21 MED ORDER — SCOPOLAMINE 1 MG/3DAYS TD PT72
1.0000 | MEDICATED_PATCH | Freq: Once | TRANSDERMAL | Status: DC
  Administered 2013-10-21: 1.5 mg via TRANSDERMAL

## 2013-10-21 MED ORDER — NALOXONE HCL 0.4 MG/ML IJ SOLN: 0.4000 mg | INTRAMUSCULAR | Status: DC | PRN

## 2013-10-21 MED ORDER — METHYLERGONOVINE MALEATE 0.2 MG PO TABS: 0.2000 mg | ORAL_TABLET | ORAL | Status: DC | PRN

## 2013-10-21 MED ORDER — NALBUPHINE HCL 10 MG/ML IJ SOLN
5.0000 mg | INTRAMUSCULAR | Status: DC | PRN
  Filled 2013-10-21: qty 1

## 2013-10-21 MED ORDER — TETANUS-DIPHTH-ACELL PERTUSSIS 5-2.5-18.5 LF-MCG/0.5 IM SUSP
0.5000 mL | Freq: Once | INTRAMUSCULAR | Status: AC
  Administered 2013-10-21: 0.5 mL via INTRAMUSCULAR
  Filled 2013-10-21: qty 0.5

## 2013-10-21 MED ORDER — FENTANYL CITRATE 0.05 MG/ML IJ SOLN: 25.0000 ug | INTRAMUSCULAR | Status: DC | PRN

## 2013-10-21 MED ORDER — METHYLERGONOVINE MALEATE 0.2 MG/ML IJ SOLN: 0.2000 mg | INTRAMUSCULAR | Status: DC | PRN

## 2013-10-21 MED ORDER — IBUPROFEN 600 MG PO TABS: 600.0000 mg | ORAL_TABLET | Freq: Four times a day (QID) | ORAL | Status: DC | PRN

## 2013-10-21 MED ORDER — PHENYLEPHRINE 8 MG IN D5W 100 ML (0.08MG/ML) PREMIX OPTIME
INJECTION | INTRAVENOUS | Status: DC | PRN
  Administered 2013-10-21: 10 ug/min via INTRAVENOUS

## 2013-10-21 MED ORDER — KETOROLAC TROMETHAMINE 30 MG/ML IJ SOLN
30.0000 mg | Freq: Four times a day (QID) | INTRAMUSCULAR | Status: AC | PRN
  Administered 2013-10-21: 30 mg via INTRAMUSCULAR

## 2013-10-21 MED ORDER — FENTANYL CITRATE 0.05 MG/ML IJ SOLN
INTRAMUSCULAR | Status: DC | PRN
  Administered 2013-10-21: 100 ug via INTRAVENOUS

## 2013-10-21 MED ORDER — SIMETHICONE 80 MG PO CHEW
80.0000 mg | CHEWABLE_TABLET | ORAL | Status: DC
  Administered 2013-10-21 – 2013-10-23 (×2): 80 mg via ORAL
  Filled 2013-10-21 (×2): qty 1

## 2013-10-21 MED ORDER — LACTATED RINGERS IV SOLN
INTRAVENOUS | Status: DC | PRN
  Administered 2013-10-21: 13:00:00 via INTRAVENOUS

## 2013-10-21 MED ORDER — SODIUM CHLORIDE 0.9 % IJ SOLN: 3.0000 mL | INTRAMUSCULAR | Status: DC | PRN

## 2013-10-21 MED ORDER — ONDANSETRON HCL 4 MG/2ML IJ SOLN: 4.0000 mg | Freq: Three times a day (TID) | INTRAMUSCULAR | Status: DC | PRN

## 2013-10-21 MED ORDER — KETOROLAC TROMETHAMINE 30 MG/ML IJ SOLN
30.0000 mg | Freq: Four times a day (QID) | INTRAMUSCULAR | Status: AC | PRN
  Administered 2013-10-21: 30 mg via INTRAVENOUS
  Filled 2013-10-21: qty 1

## 2013-10-21 MED ORDER — IBUPROFEN 600 MG PO TABS
600.0000 mg | ORAL_TABLET | Freq: Four times a day (QID) | ORAL | Status: DC
  Administered 2013-10-22 – 2013-10-23 (×6): 600 mg via ORAL
  Filled 2013-10-21 (×6): qty 1

## 2013-10-21 MED ORDER — MIDAZOLAM HCL 2 MG/2ML IJ SOLN
INTRAMUSCULAR | Status: DC | PRN
  Administered 2013-10-21 (×2): 2 mg via INTRAVENOUS

## 2013-10-21 MED ORDER — OXYTOCIN 10 UNIT/ML IJ SOLN
40.0000 [IU] | INTRAVENOUS | Status: DC | PRN
  Administered 2013-10-21: 40 [IU] via INTRAVENOUS

## 2013-10-21 MED ORDER — PRENATAL MULTIVITAMIN CH
1.0000 | ORAL_TABLET | Freq: Every day | ORAL | Status: DC
  Administered 2013-10-22 – 2013-10-23 (×2): 1 via ORAL
  Filled 2013-10-21 (×2): qty 1

## 2013-10-21 MED ORDER — OXYTOCIN 40 UNITS IN LACTATED RINGERS INFUSION - SIMPLE MED: 62.5000 mL/h | INTRAVENOUS | Status: AC

## 2013-10-21 MED ORDER — LANOLIN HYDROUS EX OINT: 1.0000 "application " | TOPICAL_OINTMENT | CUTANEOUS | Status: DC | PRN

## 2013-10-21 MED ORDER — ACETAMINOPHEN 500 MG PO TABS
1000.0000 mg | ORAL_TABLET | Freq: Four times a day (QID) | ORAL | Status: AC
  Administered 2013-10-21 – 2013-10-22 (×3): 1000 mg via ORAL
  Filled 2013-10-21 (×3): qty 2

## 2013-10-21 SURGICAL SUPPLY — 35 items
BENZOIN TINCTURE PRP APPL 2/3 (GAUZE/BANDAGES/DRESSINGS) ×3 IMPLANT
BOOTIES KNEE HIGH SLOAN (MISCELLANEOUS) ×6 IMPLANT
CLAMP CORD UMBIL (MISCELLANEOUS) ×3 IMPLANT
CLIP FILSHIE TUBAL LIGA STRL (Clip) ×3 IMPLANT
CLOTH BEACON ORANGE TIMEOUT ST (SAFETY) ×3 IMPLANT
DRAIN JACKSON PRT FLT 10 (DRAIN) IMPLANT
DRAPE LG THREE QUARTER DISP (DRAPES) IMPLANT
DRSG OPSITE POSTOP 4X10 (GAUZE/BANDAGES/DRESSINGS) ×3 IMPLANT
DURAPREP 26ML APPLICATOR (WOUND CARE) ×3 IMPLANT
ELECT REM PT RETURN 9FT ADLT (ELECTROSURGICAL) ×3
ELECTRODE REM PT RTRN 9FT ADLT (ELECTROSURGICAL) ×2 IMPLANT
EVACUATOR SILICONE 100CC (DRAIN) IMPLANT
EXTRACTOR VACUUM M CUP 4 TUBE (SUCTIONS) IMPLANT
GLOVE BIOGEL PI IND STRL 7.0 (GLOVE) ×2 IMPLANT
GLOVE BIOGEL PI INDICATOR 7.0 (GLOVE) ×1
GLOVE ECLIPSE 6.5 STRL STRAW (GLOVE) ×3 IMPLANT
GOWN STRL REUS W/TWL LRG LVL3 (GOWN DISPOSABLE) ×6 IMPLANT
KIT ABG SYR 3ML LUER SLIP (SYRINGE) IMPLANT
NEEDLE HYPO 22GX1.5 SAFETY (NEEDLE) ×3 IMPLANT
NEEDLE HYPO 25X5/8 SAFETYGLIDE (NEEDLE) IMPLANT
NS IRRIG 1000ML POUR BTL (IV SOLUTION) ×6 IMPLANT
PACK C SECTION WH (CUSTOM PROCEDURE TRAY) ×3 IMPLANT
PAD OB MATERNITY 4.3X12.25 (PERSONAL CARE ITEMS) ×3 IMPLANT
RTRCTR C-SECT PINK 25CM LRG (MISCELLANEOUS) ×3 IMPLANT
STRIP CLOSURE SKIN 1/2X4 (GAUZE/BANDAGES/DRESSINGS) ×3 IMPLANT
SUT CHROMIC GUT AB #0 18 (SUTURE) ×3 IMPLANT
SUT MNCRL AB 3-0 PS2 27 (SUTURE) ×3 IMPLANT
SUT SILK 2 0 FSL 18 (SUTURE) IMPLANT
SUT VIC AB 0 CTX 36 (SUTURE) ×2
SUT VIC AB 0 CTX36XBRD ANBCTRL (SUTURE) ×4 IMPLANT
SUT VIC AB 1 CT1 36 (SUTURE) ×6 IMPLANT
SYR 20CC LL (SYRINGE) ×3 IMPLANT
TOWEL OR 17X24 6PK STRL BLUE (TOWEL DISPOSABLE) ×3 IMPLANT
TRAY FOLEY CATH 14FR (SET/KITS/TRAYS/PACK) ×3 IMPLANT
WATER STERILE IRR 1000ML POUR (IV SOLUTION) ×3 IMPLANT

## 2013-10-21 NOTE — Interval H&P Note (Signed)
History and Physical Interval Note:  10/21/2013 11:26 AM  Lynn Nolan  has presented today for surgery, with the diagnosis of Monochorionic-Monoamniotic Twins  32 weeks  this pt is in154 as of 10/10/2013  The various methods of treatment have been discussed with the patient and family. After consideration of risks, benefits and other options for treatment, the patient has consented to  Procedure(s): PRIMARY CESAREAN SECTION (N/A) with bilateral tubal ligationas a surgical intervention .  The patient's history has been reviewed, patient examined, no change in status, stable for surgery.  I have reviewed the patient's chart and labs.   Tubal ligation reviewed with the patient  Procedure reviewed with need for general anesthesia. Informed of the failure rate of 1/500 and irreversibility. Risks of operative complications reviewed with possible anesthetic complication, infection, bleeding and injury to intra-abdominal organ. Advantage of local anesthesia for vasectomy also mentioned.  Questions were answered to the patient's satisfaction.     [name] A

## 2013-10-21 NOTE — Transfer of Care (Signed)
Immediate Anesthesia Transfer of Care Note  Patient: Lynn Nolan  Procedure(s) Performed: Procedure(s) with comments: PRIMARY CESAREAN SECTION WITH BILATERAL TUBAL LIGATION (Bilateral) - filshie clips  Patient Location: PACU  Anesthesia Type:Epidural  Level of Consciousness: awake, alert  and oriented  Airway & Oxygen Therapy: Patient Spontanous Breathing  Post-op Assessment: Report given to PACU RN and Post -op Vital signs reviewed and stable  Post vital signs: Reviewed and stable  Complications: No apparent anesthesia complications

## 2013-10-21 NOTE — Anesthesia Procedure Notes (Signed)
Epidural Patient location during procedure: OR Start time: 10/21/2013 12:49 PM  Staffing Anesthesiologist: Brayton CavesJACKSON, Rashi Granier Performed by: anesthesiologist   Preanesthetic Checklist Completed: patient identified, site marked, surgical consent, pre-op evaluation, timeout performed, IV checked, risks and benefits discussed and monitors and equipment checked  Epidural Patient position: sitting Prep: site prepped and draped and DuraPrep Patient monitoring: continuous pulse ox and blood pressure Approach: midline Injection technique: LOR air  Needle:  Needle type: Tuohy  Needle gauge: 17 G Needle length: 9 cm and 9 Needle insertion depth: 5 cm cm Catheter type: closed end flexible Catheter size: 19 Gauge Catheter at skin depth: 10 cm Test dose: negative  Assessment Events: blood not aspirated, injection not painful, no injection resistance, negative IV test and no paresthesia  Additional Notes Patient identified.  Risk benefits discussed including failed block, incomplete pain control, headache, nerve damage, paralysis, blood pressure changes, nausea, vomiting, reactions to medication both toxic or allergic, and postpartum back pain.  Patient expressed understanding and wished to proceed.  All questions were answered.  Sterile technique used throughout procedure and epidural site dressed with sterile barrier dressing. No paresthesia or other complications noted.The patient did not experience any signs of intravascular injection such as tinnitus or metallic taste in mouth nor signs of intrathecal spread such as rapid motor block. Please see nursing notes for vital signs.

## 2013-10-21 NOTE — Progress Notes (Signed)
I offered pre-op support to Lynn Nolan.  She reported that she is doing fine at this time, FOB is present with her as well.  We will follow up after delivery.  Centex CorporationChaplain Katy Sritha Chauncey Pager, [phone] 10:03 AM   10/21/13 0900  Clinical Encounter Type  Visited With Patient and family together  Visit Type Follow-up

## 2013-10-21 NOTE — Anesthesia Preprocedure Evaluation (Addendum)
Anesthesia Evaluation  Patient identified by MRN, date of birth, ID band Patient awake    Reviewed: Allergy & Precautions, H&P , NPO status , Patient's Chart, lab work & pertinent test results  Airway Mallampati: II      Dental   Pulmonary Current Smoker,  breath sounds clear to auscultation        Cardiovascular Exercise Tolerance: Good Rhythm:regular Rate:Normal     Neuro/Psych PSYCHIATRIC DISORDERS Anxiety Depression    GI/Hepatic   Endo/Other    Renal/GU      Musculoskeletal   Abdominal   Peds  Hematology   Anesthesia Other Findings   Reproductive/Obstetrics (+) Pregnancy                          Anesthesia Physical Anesthesia Plan  ASA: II  Anesthesia Plan: Epidural   Post-op Pain Management:    Induction:   Airway Management Planned:   Additional Equipment:   Intra-op Plan:   Post-operative Plan:   Informed Consent: I have reviewed the patients History and Physical, chart, labs and discussed the procedure including the risks, benefits and alternatives for the proposed anesthesia with the patient or authorized representative who has indicated his/her understanding and acceptance.     Plan Discussed with: Anesthesiologist, CRNA and Surgeon  Anesthesia Plan Comments:        Anesthesia Quick Evaluation

## 2013-10-21 NOTE — Op Note (Signed)
Preoperative diagnosis: Intrauterine pregnancy at 32 weeks with monoamniotic-monochorionic twin gestation, 2 previous cesarean section and desire for sterilization  Post operative diagnosis: Same  Anesthesia: Spinal  Anesthesiologist: Dr. Brayton CavesFreeman Jackson  Procedure: Repeat low transverse cesarean section and bilateral tubal ligation with Filschie clips  Surgeon: Dr. Dois DavenportSandra Jenafer Winterton  Assistant: Henreitta LeberElmira Powell  PA-C  Estimated blood loss: 500 cc  Procedure:  After being informed of the planned procedure and possible complications including bleeding, infection, injury to other organs, informed consent is obtained. The patient is taken to OR #9 and given spinal anesthesia without complication. She is placed in the dorsal decubitus position with the pelvis tilted to the left. She is then prepped and draped in a sterile fashion. A Foley catheter is inserted in her bladder.  After assessing adequate level of anesthesia, we infiltrate the suprapubic area with 20 cc of Marcaine 0.25 and perform a Pfannenstiel incision which is brought down sharply to the fascia. The fascia is entered in a low transverse fashion. Linea alba is dissected. Peritoneum is entered in a midline fashion. An Alexis retractor is easily positioned.   The myometrium is then entered in a low transverse fashion, 2 cm above the vesico-uterine junction ; first with knife and then extended bluntly. Amniotic fluid is clear. We assist the birth of a female  infant in vertex  presentation. Mouth and nose are suctioned.  The cord is clamped and sectioned. The baby is given to the neonatologist present in the room. We then rapidly assist the birth of the second female twin in footling breech presentation. Mouth and nose are suctioned.  The cord is clamped and sectioned. The baby is also given to the neonatologist present in the room.  We note a true cord knot for baby A which is built around baby B cord.   10 cc of blood is drawn from the  umbilical vein of each baby.Cord A is identified with a cord clamp.The placenta is allowed to deliver spontaneously. It is complete and the both cords have 3 vessels. Uterine revision is negative.  We proceed with closure of the myometrium in 2 layers: First with a running locked suture of 0 Vicryl, then with a Lembert suture of 0 Vicryl imbricating the first one. Hemostasis is completed with cauterization on peritoneal edges and 2 figure-of-eight stitches of 0-Vicryl.  Both paracolic gutters are cleaned. Both tubes and ovaries are assessed and normal. The pelvis is profusely irrigated with warm saline to confirm a satisfactory hemostasis. We proceed with tubal ligation by properly placing a Filschie clip on each tube in the isthmo-ampullary area.  Retractors and sponges are removed. Under fascia hemostasis is completed with cauterization. The fascia is then closed with 2 running sutures of 0 Vicryl meeting midline. The wound is irrigated with warm saline and hemostasis is completed with cauterization. The skin is closed with a subcuticular suture of 3-0 Monocryl and Steri-Strips.  Instrument and sponge count is complete x2. Estimated blood loss is 500 cc.  The procedure is well tolerated by the patient who is taken to recovery room in a well and stable condition.  Baby A, named Kara Meadmma, was born at 13:13 and received an Apgar of 5  at 1 minute and 8 at 5 minutes. Baby B, named Minus LibertyRosie, was born at 13:14 and received an Apgar of 6 at 1 minute and 8 at 5 minutes.   Specimen: Placenta sent to pathology.   ZOXWRU,EAVWUJRIVARD,Tyquan Carmickle A MD 2/9/20152:14 PM

## 2013-10-21 NOTE — Anesthesia Postprocedure Evaluation (Signed)
  Anesthesia Post Note  Patient: Lynn Nolan  Procedure(s) Performed: Procedure(s) (LRB): PRIMARY CESAREAN SECTION WITH BILATERAL TUBAL LIGATION (Bilateral)  Anesthesia type: Epidural  Patient location: PACU  Post pain: Pain level controlled  Post assessment: Post-op Vital signs reviewed  Last Vitals:  Filed Vitals:   10/21/13 1430  BP: 108/67  Pulse: 88  Temp:   Resp: 12    Post vital signs: Reviewed  Level of consciousness: awake  Complications: No apparent anesthesia complications. Question of breakthrough pain after babies delivered. IV ketamine used.  Patient happy with care and moving legs

## 2013-10-21 NOTE — Progress Notes (Signed)
Physical Therapy Note   Note patient for c-section today.  Will discharge patient from PT.  Please reorder if patient has any needs post surgery. Thank you, 10/21/2013 Corlis HoveMargie Karleen Seebeck, PT [phone]

## 2013-10-21 NOTE — Anesthesia Postprocedure Evaluation (Signed)
  Anesthesia Post-op Note  Patient: Lynn Nolan  Procedure(s) Performed: Procedure(s) with comments: PRIMARY CESAREAN SECTION WITH BILATERAL TUBAL LIGATION (Bilateral) - filshie clips  Patient Location: PACU and Women's Unit  Anesthesia Type:Epidural  Level of Consciousness: awake, alert , oriented and patient cooperative  Airway and Oxygen Therapy: Patient Spontanous Breathing  Post-op Pain: none  Post-op Assessment: Post-op Vital signs reviewed, Patient's Cardiovascular Status Stable and No signs of Nausea or vomiting  Post-op Vital Signs: Reviewed and stable  Complications: No apparent anesthesia complications

## 2013-10-22 ENCOUNTER — Encounter (HOSPITAL_COMMUNITY): Payer: Self-pay | Admitting: Obstetrics and Gynecology

## 2013-10-22 LAB — CBC
HCT: 32 % — ABNORMAL LOW (ref 36.0–46.0)
HEMOGLOBIN: 11.2 g/dL — AB (ref 12.0–15.0)
MCH: 32.4 pg (ref 26.0–34.0)
MCHC: 35 g/dL (ref 30.0–36.0)
MCV: 92.5 fL (ref 78.0–100.0)
PLATELETS: 135 10*3/uL — AB (ref 150–400)
RBC: 3.46 MIL/uL — ABNORMAL LOW (ref 3.87–5.11)
RDW: 13.4 % (ref 11.5–15.5)
WBC: 9 10*3/uL (ref 4.0–10.5)

## 2013-10-22 LAB — RPR: RPR: NONREACTIVE

## 2013-10-22 NOTE — Progress Notes (Signed)
10/22/13 1000  Clinical Encounter Type  Visited With Patient and family together (FOB)  Visit Type Spiritual support;Social support  Spiritual Encounters  Spiritual Needs Emotional   Followed up with Lynn Nolan to offer opportunity to share and process delivery story and feelings about birth.  Per pt, she is relieved that her surgery is complete and that Lynn Nolan and Lynn Nolan are doing well, was very pleased to get to hold Lynn Nolan, and looks forward to the flexibility of being able to go home after such a long stay.  [place] will follow family as we see them in the NICU, but please also page as needs arise:  [phone].  Thank you.  [address]Chaplain Aldric Wenzler PomeroyLunde[place], [place] [phone]

## 2013-10-22 NOTE — Lactation Note (Signed)
This note was copied from the chart of [name]. Lactation Consultation Note  Initial consult with this mom of twins, now 24 hours post partum, and 32 1/7 weeks corrected gestation. Mom is an experienced breast feeder, but this is her first time pumping. I did teaching with her from the NICU booklet on EBM, showed her how to set the premie setting, and how to hand express.  Mom is not active with WIC this pregnancy, to I advised her to call to apply. Mom was able to express 2 tiny drops fo colostrum. Mom's milk may be delayed coming in, due to being on bedrest for 6 weeks PTD. Mom knows to call for questions/concerns.  Patient Name: Lynn Nolan Today's Date: 10/22/2013 Reason for consult: Initial assessment;NICU baby;Multiple gestation   Maternal Data Formula Feeding for Exclusion: Yes (babies in NICU) Infant to breast within first hour of birth: No Breastfeeding delayed due to:: Infant status Has patient been taught Hand Expression?: Yes Does the patient have breastfeeding experience prior to this delivery?: Yes  Feeding    LATCH Score/Interventions                      Lactation Tools Discussed/Used WIC Program: No (I advised mom to call today and det up application appointment - will need a DEP) Pump Review: Setup, frequency, and cleaning;Milk Storage;Other (comment) (to protect mom's milk supply and to provide EBm for the babies) Initiated by:: bedsie rn Date initiated:: 10/21/13   Consult Status Consult Status: Follow-up Date: 10/23/13 Follow-up type: In-patient    Lynn Nolan, Lynn Nolan 10/22/2013, 2:19 PM

## 2013-10-22 NOTE — Progress Notes (Signed)
Subjective: Postpartum Day 1: Cesarean Delivery Patient reports tolerating PO and no problems voiding.    Objective: Vital signs in last 24 hours: Temp:  [97.4 F (36.3 C)-99.3 F (37.4 C)] 97.8 F (36.6 C) (02/10 0558) Pulse Rate:  [71-90] 71 (02/10 0558) Resp:  [12-20] 18 (02/10 0558) BP: (96-120)/(49-86) 102/49 mmHg (02/10 0558) SpO2:  [94 %-100 %] 97 % (02/10 0558)  Physical Exam:  General: no distress Lochia: appropriate Uterine Fundus: firm Incision: healing well DVT Evaluation: No evidence of DVT seen on physical exam.   Recent Labs  10/21/13 0755 10/22/13 0545  HGB 12.3 11.2*  HCT 35.1* 32.0*    Assessment/Plan: Status post Cesarean section. Doing well postoperatively.  The patient is pumping her breast for her twins. Both babies are in the NICU at this time. We will plan to discharge tomorrow.  Natania Finigan V 10/22/2013, 10:33 AM

## 2013-10-22 NOTE — Progress Notes (Addendum)
Clinical Social Work Department PSYCHOSOCIAL ASSESSMENT - MATERNAL/CHILD 10/22/2013  Patient:  Lynn Nolan,Lynn Nolan  Account Number:  [id number]  Admit Date:  09/06/2013  Childs Name:   Lynn Nolan  Lynn Nolan    Clinical Social Worker:  Reeder Brisby, LCSW   Date/Time:  10/22/2013 11:30 AM  Date Referred:        Other referral source:   No referral-NICU admission    I:  FAMILY / HOME ENVIRONMENT Child's legal guardian:  PARENT  Guardian - Name Guardian - Age Guardian - Address  Lynn Nolan 22 [address] [place], [place] [address]  Lynn Nolan 25 [place]   Other household support members/support persons Name Relationship DOB  Lynn Nolan GRAND MOTHER    AUNT 17  Lynn "[name]" BROTHER 4  Lynn "Isaiah" BROTHER 2.5   Other support:   MOB states her mother and FOB are her main support people.    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  FOB recently lost his job "detailing big rigs" and works in concrete, but MOB states this is seasonal.   Financial resources:  Medicaid If Medicaid - County:  [place]  School / Grade:   Maternity Care Coordinator / Child Services Coordination / Early Interventions:  Cultural issues impacting care:   None stated    III  STRENGTHS Strengths  Adequate Resources  Compliance with medical plan  Home prepared for Child (including basic supplies)  Other - See comment  Supportive family/friends  Understanding of illness   Strength comment:  Pediatric follow up will be with Dr. Sumner at [facility]   IV  RISK FACTORS AND CURRENT PROBLEMS Current Problem:  YES   Risk Factor & Current Problem Patient Issue Family Issue Risk Factor / Current Problem Comment  Mental Illness Y N hx of depression   N N     V  SOCIAL WORK ASSESSMENT  CSW met with MOB in her third  floor room/320 to introduce myself, complete assessment and offer support due to NICU admission of 32 week twin girls.  MOB was pleasant and welcoming.  She was on hold with her boyfriend, whom she states has a flat tire in the parking lot.  CSW offered to come back at a later time, but she decided to hang up the phone and talk with CSW at this time.  CSW acknowledged MOB's extended hospitalization thus far and asked how she coped with this.  She replied that she does not know and that she cried a lot.  CSW asked how she is feeling now that the babies are here and discussed coping with their NICU admissions.  MOB is happy the babies are here and healthy and is glad to be getting to go home soon to be with her two other children, but states at the same time, she is dreading leaving her babies in the hospital.  CSW validated these feelings and informed her that what she is feeling is perfectly normal.  CSW discussed what to expect from a NICU admission, in general terms, which MOB states was helpful.  CSW also discussed signs and symptoms of PPD and asked MOB to talk about her symptoms of PPD after she had her first child.  She states she got to a point where she felt like she couldn't handle things anymore and put the baby in his crib   and called her mom to come over and help her.  CSW informed her that she absolutely did the right thing.  MOB states her mother took her to the doctor, but her OB office at the time was not supportive and sent her to Behavioral Health, whom MOB states also did nothing to treat her symptoms.  MOB states she just struggled through and asked to be put on medication as soon as she had her second son.  CSW asked her if she plans to start medication at this point and she states she is not sure and does not like asking for medication.  CSW discussed the emotional stress of having babies in the NICU as well as juggling caring for other children at home and ultimately recommended starting an  antidepressant, especially given her hx of PPD.  MOB is in agreement.  CSW offered to call her OB on call and MOB happily agreed.  MOB cannot remember what she took after her last child.  CSW talked about other coping mechanisms and the importance of talking with CSW or her doctor if she is concerned about her emotional health at any time.  She agrees.  (CSW spoke with Dr. Stringer who agreed to start an antidepressant.)  MOB states she has not been able to get baby items at this point and that she had given away most things from her sons, as she was not planning on having more children.  She states anything she has was purchased by FOB, so she needs to take inventory when she gets home.  CSW asked her to do what she can to get prepared while babies are in the hospital, but to let CSW know prior to babies' discharges if she is unable to get any of the basic items.  She agreed.  CSW asked about her relationship with FOB.  MOB seems to be somewhat indifferent when she talks about FOB, but states they have been together 2 years.  She states he was not supportive while she was a patient in antenatal for a month and a half.  She states he wasn't present.  She thinks he was anxious about having two babies on the way.  She states he was great on the day of delivery and thinks he is excited about the babies at this time.  She states they are in a relationship at this time and they plan to get a house together again in [place], where he lives and she is from.  MOB states he has a 3 year old daughter, whom he sees regularly, and that he is not the father of her two sons.  They have two different fathers, both of whom are not involved.  Overall, MOB seems to coping well at this time and states she is feeling well emotionally at this time.  She seemed appreciative of the conversation with CSW and agrees to contact CSW if she has any questions, concerns or needs while her babies are in the NICU.  She states no issues with  transportation and understands to call if she is unable to visit.  CSW understands that she will be relying on her mother for transportation and that she has 2 small children at home, but requests that she try to be here at least every other day.  MOB states she will want to be here everyday and will do her best to be here as much as she possibly can.  CSW is not aware of any social concerns at   this time.   VI SOCIAL WORK PLAN Social Work Plan  Psychosocial Support/Ongoing Assessment of Needs  Patient/Family Education   Type of pt/family education:   Ongoing support services offered by NICU CSW  PPD signs and symptoms  What to expect from a NICU admission (in general terms)   If child protective services report - county:   If child protective services report - date:   Information/referral to community resources comment:   No referral needs noted at this time.   Other social work plan:   

## 2013-10-23 ENCOUNTER — Ambulatory Visit: Payer: Self-pay

## 2013-10-23 LAB — TYPE AND SCREEN
ABO/RH(D): O NEG
ANTIBODY SCREEN: POSITIVE
DAT, IgG: NEGATIVE
UNIT DIVISION: 0
Unit division: 0

## 2013-10-23 MED ORDER — IBUPROFEN 600 MG PO TABS: 600.0000 mg | ORAL_TABLET | Freq: Four times a day (QID) | ORAL | Status: DC | PRN

## 2013-10-23 MED ORDER — OXYCODONE-ACETAMINOPHEN 5-325 MG PO TABS: 1.0000 | ORAL_TABLET | ORAL | Status: DC | PRN

## 2013-10-23 MED ORDER — RHO D IMMUNE GLOBULIN 1500 UNIT/2ML IJ SOLN
300.0000 ug | Freq: Once | INTRAMUSCULAR | Status: AC
  Administered 2013-10-23: 300 ug via INTRAMUSCULAR
  Filled 2013-10-23: qty 2

## 2013-10-23 NOTE — Lactation Note (Signed)
This note was copied from the chart of GIRLA [name] Phariss. Lactation Consultation Note    Follow up consult with this mom of NICU twins, now 672 days old, and 32 2/7 weeks corrected gestation. Mom is being discharged to home today. I encouraged her to call WIC to apply, but she never did, so I could not loan her a DEP. Mom has not been compliant  With pumping, and has not expressed any colostrum as of yet. Mom breast fed her first 2 children. I reviewed with mom how to use the manual hand pump, and encouraged her to increase her frequency of pumping. Engorgement care also reviewed. I will follow this family in the NICU.  Patient Name: Sheran LawlessGIRLA Marae Irby ONGEX'BToday's Date: 10/23/2013 Reason for consult: Follow-up assessment   Maternal Data    Feeding Feeding Type: Formula Length of feed: 30 min  LATCH Score/Interventions                      Lactation Tools Discussed/Used Tools: Pump WIC Program: No (mom  has not called for appointment) Pump Review: Setup, frequency, and cleaning   Consult Status Consult Status: PRN Follow-up type:  (in NICU)    Alfred LevinsLee, Daejon Lich Anne 10/23/2013, 2:23 PM

## 2013-10-23 NOTE — Discharge Summary (Signed)
Cesarean Section Delivery Discharge Summary  Lynn Nolan  DOB:    June 02, 1991 MRN:    [id number] CSN:    [id number]  Date of admission:                  09/06/14  Date of discharge:                   10/23/13  Procedures this admission:  Repeat LTCS for mono/mono twins, BTL  Date of Delivery: 10/22/13  Newborn Data:    Lynn Nolan, Lynn Nolan [[id number]]  Live born female  Birth Weight: 3 lb 15.5 oz (1800 g) APGAR: 5, 8   Lynn Nolan, Lynn Nolan [[id number]]  Live born female  Birth Weight: 3 lb 10.9 oz (1670 g) APGAR: 6, 8  Babies remain in NICU at time of mother's d/c. History of Present Illness:  [name] is a 23 y.o. female, [phone], who presents at [redacted]w[redacted]d weeks gestation. The patient has been followed at the [facility]cology division of [facility].    Her pregnancy has been complicated by:  Patient Active Problem List   Diagnosis Date Noted  . Cesarean delivery delivered 10/21/2013  . Twin gestation, monochorionic monoamniotic 09/06/2013  . History of pyelonephritis 09/06/2013  . History of postpartum depression 09/06/2013  . History of cesarean delivery, currently pregnant 09/06/2013  . H/o LGA (large for gestational age) fetus 09/06/2013  . Rh negative status during pregnancy 09/06/2013  . Spina bifida, child of prior pregnancy, currently pregnant 09/06/2013    Hospital course:  The patient was admitted for monitoring of mono/mono twins on 09/06/14, with betamethasone given just prior to admission.  She received intensive fetal monitoring and fetal assessment during her almost 8 week stay. Decision was made to deliver her by repeat C/S at 32 weeks, which was on 10/23/13 by Dr. Estanislado Pandy.  Babies were in good condition, but were taken to NICU due to prematurity.  Her postpartum course was not complicated. She was discharged to home on postpartum day 2 doing  well.  Feeding:  breast  Contraception:  bilateral tubal ligation  Discharge hemoglobin:  Hemoglobin  Date Value Ref Range Status  10/22/2013 11.2* 12.0 - 15.0 g/dL Final     HCT  Date Value Ref Range Status  10/22/2013 32.0* 36.0 - 46.0 % Final    Discharge Physical Exam:   General: alert Lochia: appropriate Uterine Fundus: firm Incision: Harmony dressing CDI DVT Evaluation: No evidence of DVT seen on physical exam. Negative Homan's sign.  Intrapartum Procedures: cesarean: low cervical, transverse Postpartum Procedures: none Complications-Operative and Postpartum: none  Discharge Diagnoses: Mono/mono twins, repeat LTCS, BTL  Discharge Information:  Activity:           Per [facility] handout Diet:                routine Medications: Ibuprofen and Percocet Condition:      stable Instructions:  Care After Cesarean Delivery  Refer to this sheet in the next few weeks. These instructions provide you with information on caring for yourself after your procedure. Your caregiver may also give you specific instructions. Your treatment has been planned according to current medical practices, but problems sometimes occur. Call your caregiver if you have any problems or questions after you go home. HOME CARE INSTRUCTIONS  Only take over-the-counter or prescription medicines as directed by your caregiver.  Do not drink alcohol, especially if you are breastfeeding or taking medicine to relieve  pain.  Do not chew or smoke tobacco.  Continue to use good perineal care. Good perineal care includes:  Wiping your perineum from front to back.  Keeping your perineum clean.  Check your cut (incision) daily for increased redness, drainage, swelling, or separation of skin.  Clean your incision gently with soap and water every day, and then pat it dry. If your caregiver says it is okay, leave the incision uncovered. Use a bandage (dressing) if the incision is draining fluid or appears  irritated. If the adhesive strips across the incision do not fall off within 7 days, carefully peel them off.  Hug a pillow when coughing or sneezing until your incision is healed. This helps to relieve pain.  Do not use tampons or douche until your caregiver says it is okay.  Shower, wash your hair, and take tub baths as directed by your caregiver.  Wear a well-fitting bra that provides breast support.  Limit wearing support panties or control-top hose.  Drink enough fluids to keep your urine clear or pale yellow.  Eat high-fiber foods such as whole grain cereals and breads, brown rice, beans, and fresh fruits and vegetables every day. These foods may help prevent or relieve constipation.  Resume activities such as climbing stairs, driving, lifting, exercising, or traveling as directed by your caregiver.  Talk to your caregiver about resuming sexual activities. This is dependent upon your risk of infection, your rate of healing, and your comfort and desire to resume sexual activity.  Try to have someone help you with your household activities and your newborn for at least a few days after you leave the hospital.  Rest as much as possible. Try to rest or take a nap when your newborn is sleeping.  Increase your activities gradually.  Keep all of your scheduled postpartum appointments. It is very important to keep your scheduled follow-up appointments. At these appointments, your caregiver will be checking to make sure that you are healing physically and emotionally. SEEK MEDICAL CARE IF:   You are passing large clots from your vagina. Save any clots to show your caregiver.  You have a foul smelling discharge from your vagina.  You have trouble urinating.  You are urinating frequently.  You have pain when you urinate.  You have a change in your bowel movements.  You have increasing redness, pain, or swelling near your incision.  You have pus draining from your  incision.  Your incision is separating.  You have painful, hard, or reddened breasts.  You have a severe headache.  You have blurred vision or see spots.  You feel sad or depressed.  You have thoughts of hurting yourself or your newborn.  You have questions about your care, the care of your newborn, or medicines.  You are dizzy or lightheaded.  You have a rash.  You have pain, redness, or swelling at the site of the removed intravenous access (IV) tube.  You have nausea or vomiting.  You stopped breastfeeding and have not had a menstrual period within 12 weeks of stopping.  You are not breastfeeding and have not had a menstrual period within 12 weeks of delivery.  You have a fever. SEEK IMMEDIATE MEDICAL CARE IF:  You have persistent pain.  You have chest pain.  You have shortness of breath.  You faint.  You have leg pain.  You have stomach pain.  Your vaginal bleeding saturates 2 or more sanitary pads in 1 hour. MAKE SURE YOU:   Understand  these instructions.  Will watch your condition.  Will get help right away if you are not doing well or get worse. Document Released: 05/21/2002 Document Revised: 05/23/2012 Document Reviewed: 04/25/2012 [facility] Patient Information 2014 [place], [place].   Postpartum Depression and Baby Blues  The postpartum period begins right after the birth of a baby. During this time, there is often a great amount of joy and excitement. It is also a time of considerable changes in the life of the parent(s). Regardless of how many times a mother gives birth, each child brings new challenges and dynamics to the family. It is not unusual to have feelings of excitement accompanied by confusing shifts in moods, emotions, and thoughts. All mothers are at risk of developing postpartum depression or the "baby blues." These mood changes can occur right after giving birth, or they may occur many months after giving birth. The baby blues or  postpartum depression can be mild or severe. Additionally, postpartum depression can resolve rather quickly, or it can be a long-term condition. CAUSES Elevated hormones and their rapid decline are thought to be a main cause of postpartum depression and the baby blues. There are a number of hormones that radically change during and after pregnancy. Estrogen and progesterone usually decrease immediately after delivering your baby. The level of thyroid hormone and various cortisol steroids also rapidly drop. Other factors that play a major role in these changes include major life events and genetics.  RISK FACTORS If you have any of the following risks for the baby blues or postpartum depression, know what symptoms to watch out for during the postpartum period. Risk factors that may increase the likelihood of getting the baby blues or postpartum depression include:  Havinga personal or family history of depression.  Having depression while being pregnant.  Having premenstrual or oral contraceptive-associated mood issues.  Having exceptional life stress.  Having marital conflict.  Lacking a social support network.  Having a baby with special needs.  Having health problems such as diabetes. SYMPTOMS Baby blues symptoms include:  Brief fluctuations in mood, such as going from extreme happiness to sadness.  Decreased concentration.  Difficulty sleeping.  Crying spells, tearfulness.  Irritability.  Anxiety. Postpartum depression symptoms typically begin within the first month after giving birth. These symptoms include:  Difficulty sleeping or excessive sleepiness.  Marked weight loss.  Agitation.  Feelings of worthlessness.  Lack of interest in activity or food. Postpartum psychosis is a very concerning condition and can be dangerous. Fortunately, it is rare. Displaying any of the following symptoms is cause for immediate medical attention. Postpartum psychosis symptoms  include:  Hallucinations and delusions.  Bizarre or disorganized behavior.  Confusion or disorientation. DIAGNOSIS  A diagnosis is made by an evaluation of your symptoms. There are no medical or lab tests that lead to a diagnosis, but there are various questionnaires that a caregiver may use to identify those with the baby blues, postpartum depression, or psychosis. Often times, a screening tool called the [place] Postnatal Depression Scale is used to diagnose depression in the postpartum period.  TREATMENT The baby blues usually goes away on its own in 1 to 2 weeks. Social support is often all that is needed. You should be encouraged to get adequate sleep and rest. Occasionally, you may be given medicines to help you sleep.  Postpartum depression requires treatment as it can last several months or longer if it is not treated. Treatment may include individual or group therapy, medicine, or both to address  any social, physiological, and psychological factors that may play a role in the depression. Regular exercise, a healthy diet, rest, and social support may also be strongly recommended.  Postpartum psychosis is more serious and needs treatment right away. Hospitalization is often needed. HOME CARE INSTRUCTIONS  Get as much rest as you can. Nap when the baby sleeps.  Exercise regularly. Some women find yoga and walking to be beneficial.  Eat a balanced and nourishing diet.  Do little things that you enjoy. Have a cup of tea, take a bubble bath, read your favorite magazine, or listen to your favorite music.  Avoid alcohol.  Ask for help with household chores, cooking, grocery shopping, or running errands as needed. Do not try to do everything.  Talk to people close to you about how you are feeling. Get support from your partner, family members, friends, or other new moms.  Try to stay positive in how you think. Think about the things you are grateful for.  Do not spend a lot of time  alone.  Only take medicine as directed by your caregiver.  Keep all your postpartum appointments.  Let your caregiver know if you have any concerns. SEEK MEDICAL CARE IF: You are having a reaction or problems with your medicine. SEEK IMMEDIATE MEDICAL CARE IF:  You have suicidal feelings.  You feel you may harm the baby or someone else. Document Released: 06/02/2004 Document Revised: 11/21/2011 Document Reviewed: 07/05/2011 [facility] Patient Information 2014 [place].  Discharge to: home  Follow-up Information   [facility]. Schedule an appointment as soon as possible for a visit in 6 weeks. (Call for any questions or concerns.)    Specialty:  Obstetrics and Gynecology   Contact information:   [address] [place] [id number] [phone]       Nigel BridgemanLATHAM, Cassidy Tashiro 10/23/2013

## 2013-10-23 NOTE — Progress Notes (Signed)
Pt ambulated out plan to go to nicu  Teaching complete

## 2013-10-23 NOTE — Discharge Instructions (Signed)
Postpartum Care After Cesarean Delivery °After you deliver your newborn (postpartum period), the usual stay in the hospital is 24 72 hours. If there were problems with your labor or delivery, or if you have other medical problems, you might be in the hospital longer.  °While you are in the hospital, you will receive help and instructions on how to care for yourself and your newborn during the postpartum period.  °While you are in the hospital: °· It is normal for you to have pain or discomfort from the incision in your abdomen. Be sure to tell your nurses when you are having pain, where the pain is located, and what makes the pain worse. °· If you are breastfeeding, you may feel uncomfortable contractions of your uterus for a couple of weeks. This is normal. The contractions help your uterus get back to normal size. °· It is normal to have some bleeding after delivery. °· For the first 1 3 days after delivery, the flow is red and the amount may be similar to a period. °· It is common for the flow to start and stop. °· In the first few days, you may pass some small clots. Let your nurses know if you begin to pass large clots or your flow increases. °· Do not  flush blood clots down the toilet before having the nurse look at them. °· During the next 3 10 days after delivery, your flow should become more watery and pink or brown-tinged in color. °· Ten to fourteen days after delivery, your flow should be a small amount of yellowish-white discharge. °· The amount of your flow will decrease over the first few weeks after delivery. Your flow may stop in 6 8 weeks. Most women have had their flow stop by 12 weeks after delivery. °· You should change your sanitary pads frequently. °· Wash your hands thoroughly with soap and water for at least 20 seconds after changing pads, using the toilet, or before holding or feeding your newborn. °· Your intravenous (IV) tubing will be removed when you are drinking enough fluids. °· The  urine drainage tube (urinary catheter) that was inserted before delivery may be removed within 6 8 hours after delivery or when feeling returns to your legs. You should feel like you need to empty your bladder within the first 6 8 hours after the catheter has been removed. °· In case you become weak, lightheaded, or faint, call your nurse before you get out of bed for the first time and before you take a shower for the first time. °· Within the first few days after delivery, your breasts may begin to feel tender and full. This is called engorgement. Breast tenderness usually goes away within 48 72 hours after engorgement occurs. You may also notice milk leaking from your breasts. If you are not breastfeeding, do not stimulate your breasts. Breast stimulation can make your breasts produce more milk. °· Spending as much time as possible with your newborn is very important. During this time, you and your newborn can feel close and get to know each other. Having your newborn stay in your room (rooming in) will help to strengthen the bond with your newborn. It will give you time to get to know your newborn and become comfortable caring for your newborn. °· Your hormones change after delivery. Sometimes the hormone changes can temporarily cause you to feel sad or tearful. These feelings should not last more than a few days. If these feelings last longer   than that, you should talk to your caregiver. °· If desired, talk to your caregiver about methods of family planning or contraception. °· Talk to your caregiver about immunizations. Your caregiver may want you to have the following immunizations before leaving the hospital: °· Tetanus, diphtheria, and pertussis (Tdap) or tetanus and diphtheria (Td) immunization. It is very important that you and your family (including grandparents) or others caring for your newborn are up-to-date with the Tdap or Td immunizations. The Tdap or Td immunization can help protect your newborn  from getting ill. °· Rubella immunization. °· Varicella (chickenpox) immunization. °· Influenza immunization. You should receive this annual immunization if you did not receive the immunization during your pregnancy. °Document Released: 05/23/2012 Document Reviewed: 05/23/2012 °ExitCare® Patient Information ©2014 [employer]. ° °

## 2013-10-24 LAB — RH IG WORKUP (INCLUDES ABO/RH)
ABO/RH(D): O NEG
Fetal Screen: NEGATIVE
Gestational Age(Wks): 32
Unit division: 0

## 2014-07-14 ENCOUNTER — Encounter (HOSPITAL_COMMUNITY): Payer: Self-pay | Admitting: Obstetrics and Gynecology

## 2015-02-26 IMAGING — US US OB LIMITED
1 series · 13 of 28 positions shown · non-contrast
Comparison: none

[Series 1: us ob limited · 31 acquisitions, 13 frames shown]
[im 2/31]
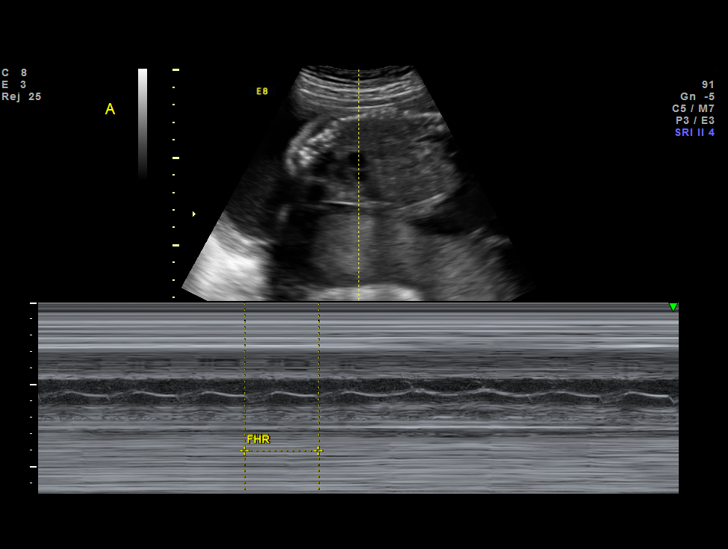
[im 4/31]
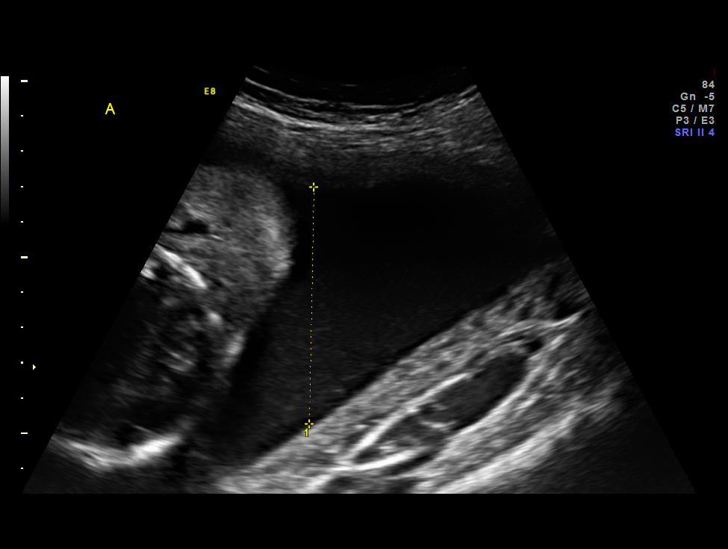
[im 6/31]
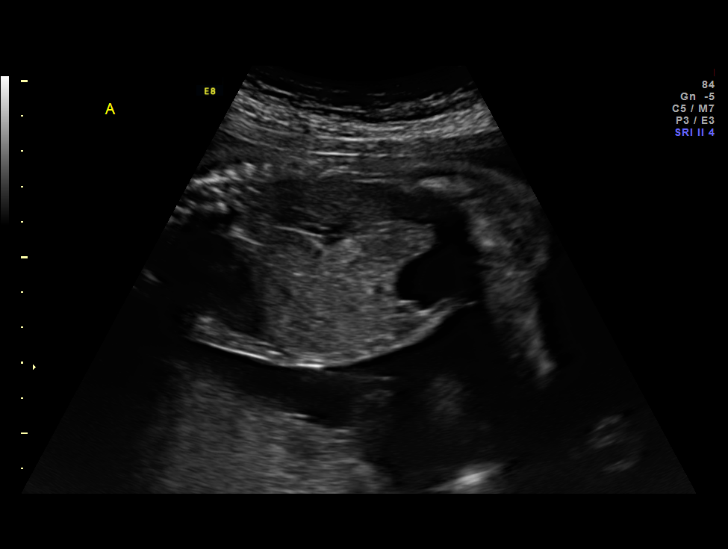
[im 8/31]
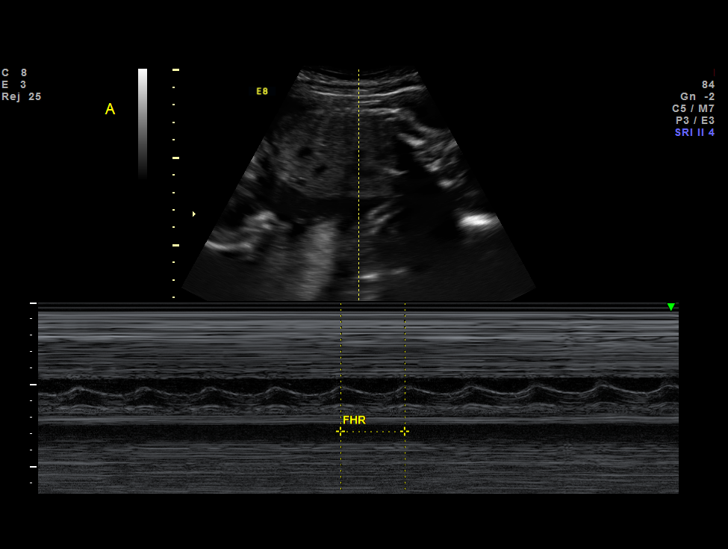
[im 11/31]
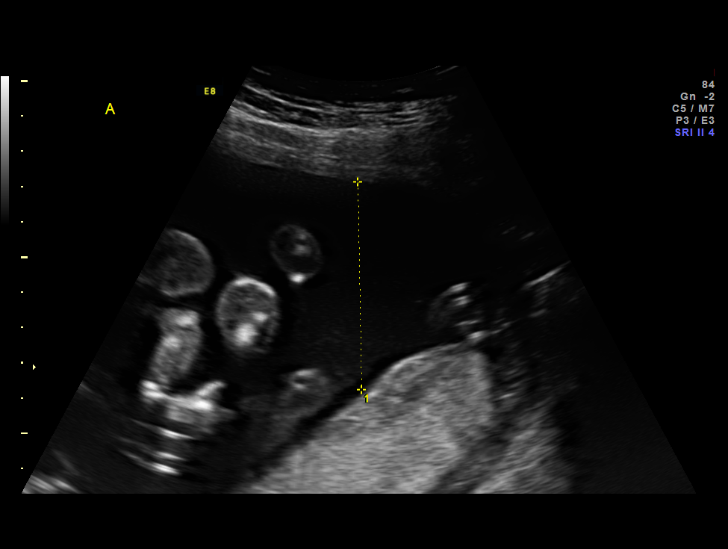
[im 13/31]
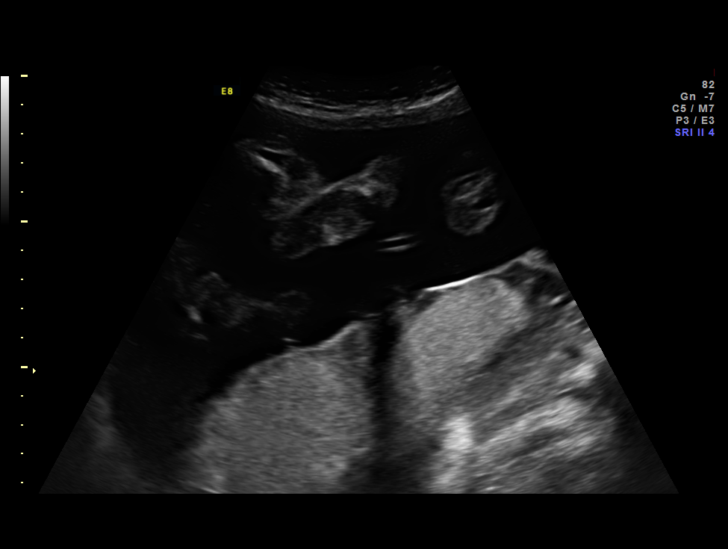
[im 16/31]
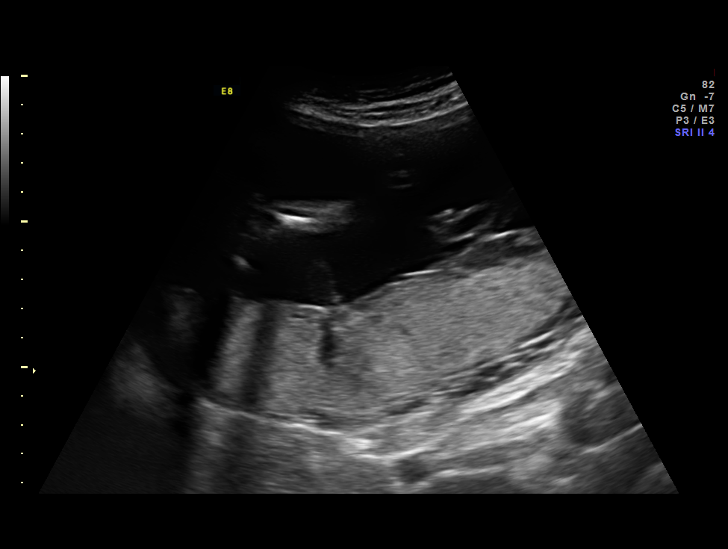
[im 18/31]
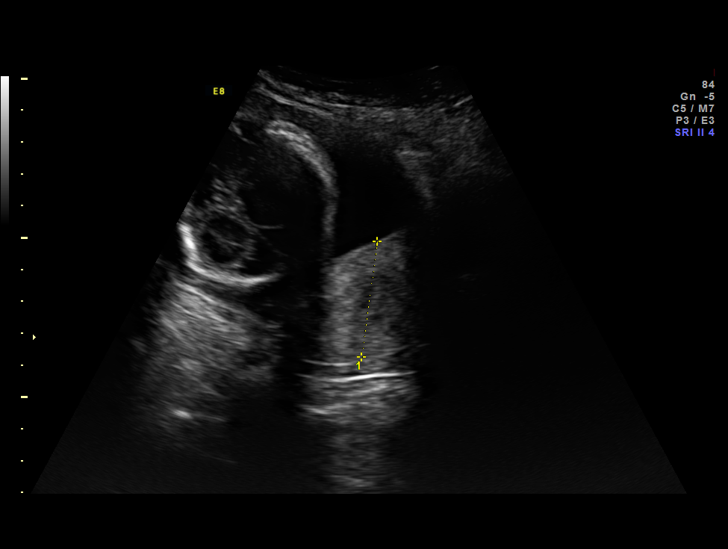
[im 21/31]
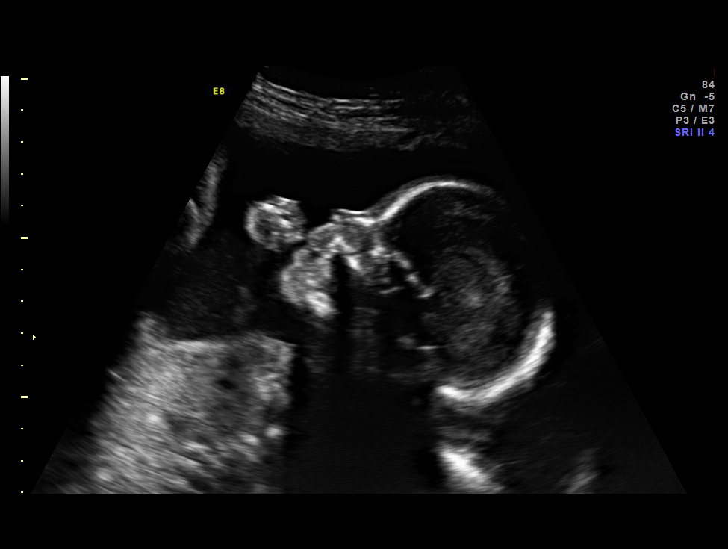
[im 23/31]
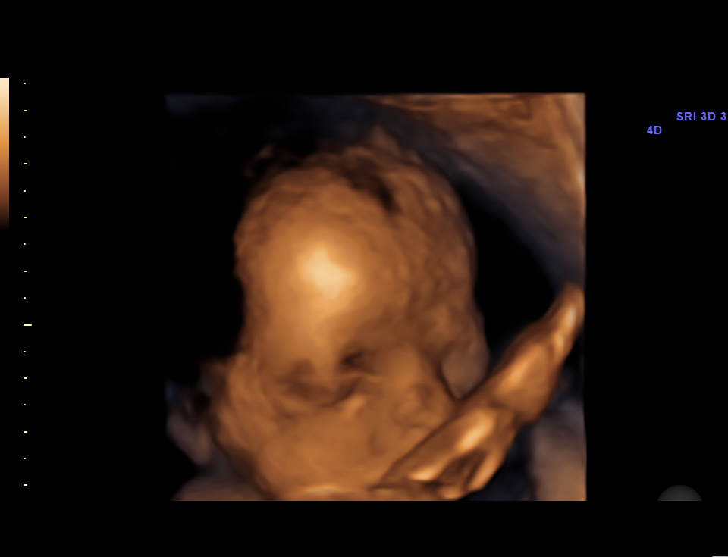
[im 25/31]
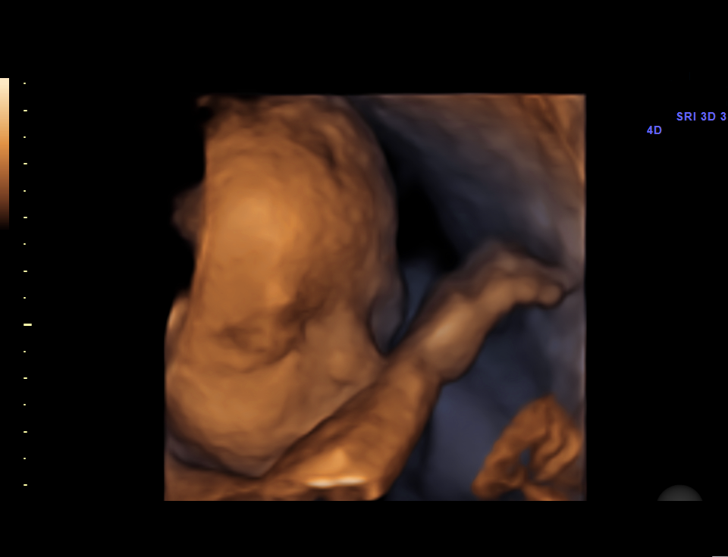
[im 27/31]
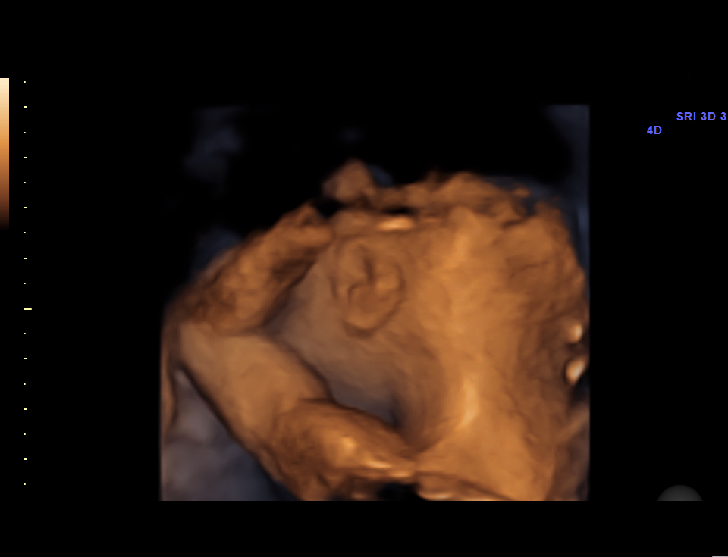
[im 29/31]
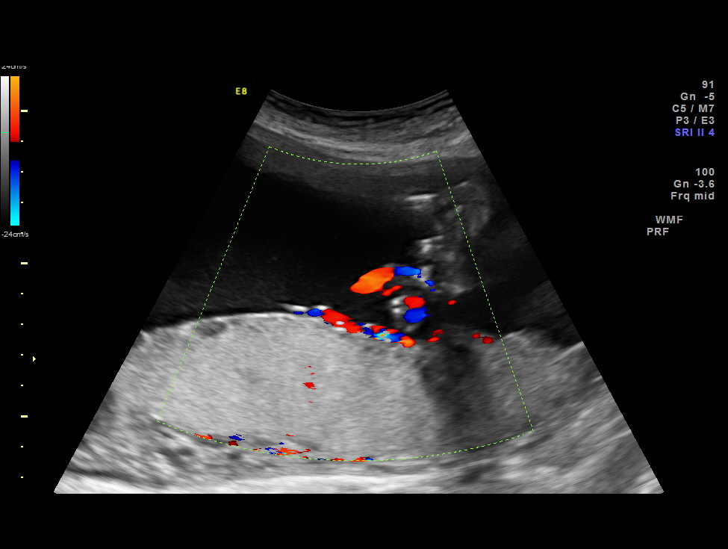

[13 of 28 positions shown; findings below may reference images not displayed]

OBSTETRICS REPORT
                      (Signed Final 08/30/2013 [DATE])

Service(s) Provided

 [HOSPITAL]                                         76815.0
Indications

 Twin gestation, Mo-Mo
 Choroid plexus cyst - Twin B
 History of genetic / anatomic abnormality - NTD
 (previous child)
 History of cesarean delivery x2, currently pregnant   654.20,
 Poor obstetric history: Previous macrosomia
 Rh negative
Fetal Evaluation (Fetus A)

 Num Of Fetuses:    2
 Fetal Heart Rate:  157                          bpm
 Cardiac Activity:  Observed
 Fetal Lie:         Right Fetus
 Presentation:      Transverse, head to
                    maternal right
 Placenta:          Posterior, above cervical
                    os
 P. Cord            Previously Visualized
 Insertion:

 Membrane Desc:     No membrane
                    visualized

 Amniotic Fluid
 AFI FV:      Subjectively within normal limits
                                             Larg Pckt:     6.7  cm
Gestational Age (Fetus A)

 LMP:           28w 1d        Date:  02/14/13                 EDD:   11/21/13
 Best:          24w 4d     Det. By:  Early Ultrasound         EDD:   12/16/13

Fetal Evaluation (Fetus B)

 Num Of Fetuses:    2
 Fetal Heart Rate:  157                          bpm
 Cardiac Activity:  Observed
 Fetal Lie:         Left Fetus
 Presentation:      Cephalic
 Placenta:          Posterior, above cervical
                    os
 P. Cord            Previously Visualized
 Insertion:

 Membrane Desc:     No membrane
                    visualized

 Amniotic Fluid
 AFI FV:      Subjectively within normal limits
                                             Larg Pckt:     5.9  cm
Gestational Age (Fetus B)

 LMP:           28w 1d        Date:  02/14/13                 EDD:   11/21/13
 Best:          24w 4d     Det. By:  Early Ultrasound         EDD:   12/16/13
Cervix Uterus Adnexa

 Cervical Length:    3.7      cm

 Cervix:       Normal appearance by transabdominal scan. Appears
               closed, without funnelling.
Impression

 Monochorionic/monoamniotic twin pregnancy at 24+4 weeks
 Both fetuses viable
 TA views of cervix were normal

 Neonatal consultation today
Recommendations

 Ms. Idowu decided to be admitted to [facility] this [REDACTED]
 at 25+1 weeks

## 2015-03-15 ENCOUNTER — Encounter (HOSPITAL_COMMUNITY): Payer: Self-pay | Admitting: *Deleted

## 2015-03-15 ENCOUNTER — Emergency Department (HOSPITAL_COMMUNITY)
Admission: EM | Admit: 2015-03-15 | Discharge: 2015-03-15 | Disposition: A | Discharge status: Home / Self Care | Payer: Self-pay | Attending: Emergency Medicine | Admitting: Emergency Medicine

## 2015-03-15 DIAGNOSIS — Z880 Allergy status to penicillin: Secondary | ICD-10-CM | POA: Insufficient documentation

## 2015-03-15 DIAGNOSIS — Z8659 Personal history of other mental and behavioral disorders: Secondary | ICD-10-CM | POA: Insufficient documentation

## 2015-03-15 DIAGNOSIS — Z87442 Personal history of urinary calculi: Secondary | ICD-10-CM | POA: Insufficient documentation

## 2015-03-15 DIAGNOSIS — Z9889 Other specified postprocedural states: Secondary | ICD-10-CM | POA: Insufficient documentation

## 2015-03-15 DIAGNOSIS — K088 Other specified disorders of teeth and supporting structures: Secondary | ICD-10-CM | POA: Insufficient documentation

## 2015-03-15 DIAGNOSIS — K0889 Other specified disorders of teeth and supporting structures: Secondary | ICD-10-CM

## 2015-03-15 DIAGNOSIS — Z720 Tobacco use: Secondary | ICD-10-CM | POA: Insufficient documentation

## 2015-03-15 MED ORDER — NAPROXEN 500 MG PO TABS: 500.0000 mg | ORAL_TABLET | Freq: Two times a day (BID) | ORAL | Status: DC

## 2015-03-15 MED ORDER — CLINDAMYCIN HCL 150 MG PO CAPS: 300.0000 mg | ORAL_CAPSULE | Freq: Four times a day (QID) | ORAL | Status: DC

## 2015-03-15 NOTE — ED Notes (Signed)
Pt states pain to left lower wisdom tooth x 1 mo and cavity to left upper molar x 2 weeks.

## 2015-03-15 NOTE — Discharge Instructions (Signed)
Dental Pain °Toothache is pain in or around a tooth. It may get worse with chewing or with cold or heat.  °HOME CARE °· Your dentist may use a numbing medicine during treatment. If so, you may need to avoid eating until the medicine wears off. Ask your dentist about this. °· Only take medicine as told by your dentist or doctor. °· Avoid chewing food near the painful tooth until after all treatment is done. Ask your dentist about this. °GET HELP RIGHT AWAY IF:  °· The problem gets worse or new problems appear. °· You have a fever. °· There is redness and puffiness (swelling) of the face, jaw, or neck. °· You cannot open your mouth. °· There is pain in the jaw. °· There is very bad pain that is not helped by medicine. °MAKE SURE YOU:  °· Understand these instructions. °· Will watch your condition. °· Will get help right away if you are not doing well or get worse. °Document Released: 02/15/2008 Document Revised: 11/21/2011 Document Reviewed: 02/15/2008 °ExitCare® Patient Information ©2015 [employer]. This information is not intended to replace advice given to you by your health care provider. Make sure you discuss any questions you have with your health care provider. ° ° ° °Emergency Department Resource Guide °1) Find a Doctor and Pay Out of Pocket °Although you won't have to find out who is covered by your insurance plan, it is a good idea to ask around and get recommendations. You will then need to call the office and see if the doctor you have chosen will accept you as a new patient and what types of options they offer for patients who are self-pay. Some doctors offer discounts or will set up payment plans for their patients who do not have insurance, but you will need to ask so you aren't surprised when you get to your appointment. ° °2) Contact Your Local Health Department °Not all health departments have doctors that can see patients for sick visits, but many do, so it is worth a call to see if yours does.  If you don't know where your local health department is, you can check in your phone book. The CDC also has a tool to help you locate your state's health department, and many state websites also have listings of all of their local health departments. ° °3) Find a Walk-in Clinic °If your illness is not likely to be very severe or complicated, you may want to try a walk in clinic. These are popping up all over the country in pharmacies, drugstores, and shopping centers. They're usually staffed by nurse practitioners or physician assistants that have been trained to treat common illnesses and complaints. They're usually fairly quick and inexpensive. However, if you have serious medical issues or chronic medical problems, these are probably not your best option. ° °No Primary Care Doctor: °- Call [facility] at  832-8000 - they can help you locate a primary care doctor that  accepts your insurance, provides certain services, etc. °- Physician Referral Service- 1-800-533-3463 ° °Chronic Pain Problems: °Organization         Address  Phone   Notes  °[facility]  (336) 297-2271 Patients need to be referred by their primary care doctor.  ° °Medication Assistance: °Organization         Address  Phone   Notes  °[place] [facility] [address] °[place], [place] [address] (336) 641-8030 --Must be a resident   of [place] °-- Must have NO insurance coverage whatsoever (no Medicaid/ Medicare, etc.) °-- The pt. MUST have a primary care doctor that directs their care regularly and follows them in the community °  °[facility]  (866) 331-1348   °[employer]  (888) 892-1162   ° °Agencies that provide inexpensive medical care: °Organization         Address  Phone   Notes  °[facility]  (336) 832-8035   °[facility]    (336) 832-7272   °[facility] [address] °[place], [place] [address] (336) 832-4777   °[facility] [address], °[place] (336) 271-4999   °[facility]    (336) 373-0678   °[facility]    (336) 272-1050   °[facility] ° [address], [place] Phone:  (336) 832-4444, Fax:  (336) 832-4440 Hours of Operation:  9 am - 6 pm, M-F.  Also accepts Medicaid/Medicare and self-pay.  °[facility] ° [address], [place] Phone: (336) [phone], Fax: (336) [phone]. Hours of Operation:  8:30 am - 5:30 pm, M-F.  Also accepts Medicaid and self-pay.  °[facility] [place] [address], [place] Phone: (336) 878-6027   °[facility] [address], [place], [place] (336)723-1848, Ext. 123 Mondays & Thursdays: 7-9 AM.  First 15 patients are seen on a first come, first serve basis. °  ° °Medicaid-accepting [place] Providers: ° °Organization         Address  Phone   Notes  °[facility] [address], [place] (336) 641-2100 Also accepts self-pay patients.  °[facility] [address], [place] ° (336) 856-9996   °[facility] [address], [place] (336) 288-8857   °[facility] [address] [place] [address], [place] (336) 299-7000   °Veita Bland [address], [place]  ° (336) [phone] Only accepts [place] Access Medicaid patients after they have their name applied to their card.  ° °Self-Pay (no insurance) in [place]: ° °Organization         Address  Phone   Notes  °Sickle Cell Patients, [facility] [address], [place] (336) 832-1970   °[facility] [address], [place] (336) 832-4400   °[facility] [facility] ° [address], [place] (336) 992-4800   °[facility]/Dr. Osei-Bonsu ° [address] [place] [address], [place] or [address], [place] (336) [phone]  Phone number for both [place] and [place] locations is the same.  °[facility] [address], [place] (336) 299-0000   °[facility] [address] [place] [address], [place] or [address] (336) 852-7530 °(336) 878-2260   °[facility] [address],  (336) 350-1642, phone; (336) 294-5005, fax Sees patients 1st and 3rd Saturday of every month.  Must not qualify for public or private insurance (i.e. Medicaid, Medicare, [place] Health Choice, Veterans' Benefits) • Household income should be no more than 200% of the poverty level •The clinic cannot treat you if you are pregnant or think you are pregnant • Sexually transmitted diseases are not treated at the clinic.  ° ° °Dental Care: °Organization         Address    Phone  Notes  °[facility] [address], [place] (336) 641-6152 Accepts children up to age 21 who are enrolled in Medicaid or [place] Health Choice; pregnant women with a Medicaid card; and children who have applied for Medicaid or [place] Health Choice, but were declined, whose parents can pay a reduced fee at time of service.  °[facility] [place]  [address], [place] (336) 641-7733 Accepts children up to age 21 who are enrolled in Medicaid or [place] Health Choice; pregnant women with a Medicaid card; and children who have applied for Medicaid or [place] Health Choice, but were declined, whose parents can pay a reduced fee at time of service.  °[facility] ° [address], [place] (336) 641-4533 Patients are seen by appointment only. Walk-ins are not accepted. [facility] will see patients 18 years of age and older. °Monday - Tuesday (8am-5pm) °Most Wednesdays (8:30-5pm) °$30 per visit, cash only  °[facility] ° [address], [place] (336) 641-4533 Patients are seen by appointment  only. Walk-ins are not accepted. [facility] will see patients 18 years of age and older. °One Wednesday Evening (Monthly: Volunteer Based).  $30 per visit, cash only  °[facility]  (919) 537-3737 for adults; Children under age 4, call Graduate Pediatric Dentistry at ([phone]) [phone]. Children aged 4-14, please call ([phone]) [phone] to request a pediatric application. ° Dental services are provided in all areas of dental care including fillings, crowns and bridges, complete and partial dentures, implants, gum treatment, root canals, and extractions. Preventive care is also provided. Treatment is provided to both adults and children. °Patients are selected via a lottery and there is often a waiting list. °  °[facility] [address], °[place] ° (336) 763-8833 [email or web address] °  °[facility] [address], [place], [place] (336)723-1848, Ext. 123 Second and Fourth Thursday of each month, opens at 6:30 AM; Clinic ends at 9 AM.  Patients are seen on a first-come first-served basis, and a limited number are seen during each clinic.  ° °[facility] ° [address], [place], [place] (336) 723-7904   Eligibility Requirements °You must have lived in [place], [place], or [place] counties for at least the last three months. °  You cannot be eligible for state or federal sponsored healthcare insurance, including Veterans Administration, Medicaid, or Medicare. °  You generally cannot be eligible for healthcare insurance through your employer.  °  How to apply: °Eligibility screenings are held every Tuesday and Wednesday afternoon from 1:00 pm until 4:00 pm. You do not need an appointment for the interview!  °[facility] [address], [place], [place] 336-631-2330   °[facility]  336-342-8273   °[facility]  336-703-3100   °[facility]  336-570-6415   ° °Behavioral Health  Resources in the Community: °Intensive Outpatient Programs °Organization         Address  Phone  Notes  °[facility] [address], [place], [place] 336-878-6098   °[facility] [address], [place], [place] 336-832-9800   °[facility] [address], [place], [place] ° 336-882-2125   °[facility] [address],  °[place],  1-800-853-5163 or 336-641-4981   °Substance Abuse Resources °Organization           Address  Phone  Notes  °[facility]  336-882-2125   °[facility]  336-784-9470   °[facility]  336-285-9073   °[facility]  336-845-3988   °Residential & Outpatient Substance Abuse Program  1-800-659-3381   °Psychological Services °Organization         Address  Phone  Notes  °[facility]  336- 832-9600   °[facility]  336- 378-7881   °[facility] [address], [place] 1-800-853-5163 or 336-641-4981   ° °Mobile Crisis Teams °Organization         Address  Phone  Notes  °[facility], Mobile Crisis Care Unit  1-877-626-1772   °Assertive °[facility] ° [address] [place], [place] 336-834-9664   °Sharon DeEsch [address] °[place] 336-554-5454   ° °Self-Help/Support Groups °Organization         Address  Phone             Notes  °[facility] - variety of support groups  336- 373-1402 Call for more information  °Narcotics Anonymous (NA), [facility] [address], °[place]  2 meetings at this location  ° °Residential Treatment Programs °Organization         Address  Phone  Notes  °[facility] [address],    °[place]  1-866-801-8205   °[facility] ° [address], [place], [place] 704-293-8524   °[facility] [address], [place] 336-845-3988 Admissions: 8am-3pm M-F  °[facility] [address]    °[place], [place] 336-841-1104   °[facility] [address], [place], [place] 336-379-7146   °[facility] [address]  °[place], [place] 336-285-9073   °[facility] - Intensive Outpatient [address], [place], [place] 336-852-3033   °[facility] [address]  °[place], [place] 1-877-615-2722 or 336-784-9470   °[facility] [address] [place], [place] 336-227-7417 Accepts Medicaid  °[facility] [address]  °[place] 1-800-659-3381 Substance Abuse/Addiction Treatment  ° °[place] Behavioral Health Resources °Organization         Address  Phone  Notes  °[facility]  (888) 581-9988   °Julie Brannon, PhD [address] [place], [place]   (336) 349-5553 or (336) 951-0000   °[facility]   [address] °[place], [place] (336) 349-4454   °[facility] [address], [place], [place] (336) 342-8316 Insurance/Medicaid/sponsorship through [facility]  °[facility] [address]                                    [place], [place] (336) 342-8316 Therapy/tele-psych/case  °[facility] [address]  ° [place], [place] (336) 349-2233    °Dr. Arfeen  (336) 349-4544   °[facility] [place]  [employer] [place] [facility] 1) [address], [place] °2) [address], [place] °3)  [address], [place] (336) 349-3220 °(336) 342-7768 ° °(336) 342-8140   °[place] Child Abuse [facility] (336) 342-1394 or (336) 342-3537 (After Hours)    ° °  °

## 2015-03-15 NOTE — ED Provider Notes (Signed)
History  This chart was scribed for non-physician practitioner, Pauline Aus, PA-C,working with Linwood Dibbles, MD, by Karle Plumber, ED Scribe. This patient was seen in room APFT22/APFT22 and the patient's care was started at 1:36 PM.  Chief Complaint  Patient presents with  . Dental Pain   The history is provided by the patient and medical records. No language interpreter was used.    HPI Comments:  Lynn Nolan is a 24 y.o. female who presents to the Emergency Department complaining of left lower dental pain that began about one month ago left upper dental pain that started approximately two weeks ago. She reports the left lower molar is coming in and she feels it may be coming in sideways. She states the left upper molar may have had a filling in it in the past but believes it came out. She has not taken anything to treat the pain. Eating makes the pain worse. She denies alleviating factors. She denies fever, chills, nausea, vomiting or facial swelling. She does not currently have a dentist.  Past Medical History  Diagnosis Date  . Anxiety   . Depression     hx of PPD  . Pyelonephritis   . LGA (large for gestational age) fetus     with previous pregnancy  . Rh negative, maternal   . Spina bifida in child of prior pregnancy, currently pregnant   . Previous cesarean section     x2   Past Surgical History  Procedure Laterality Date  . Tonsillectomy    . Cesarean section  x2  . Adenoidectomy    . Cesarean section with bilateral tubal ligation Bilateral 10/21/2013    Procedure: PRIMARY CESAREAN SECTION WITH BILATERAL TUBAL LIGATION;  Surgeon: Esmeralda Arthur, MD;  Location: [facility];  Service: Obstetrics;  Laterality: Bilateral;  filshie clips   No family history on file. History  Substance Use Topics  . Smoking status: Current Every Day Smoker -- 0.75 packs/day for 5 years    Types: Cigarettes  . Smokeless tobacco: Never Used  . Alcohol Use: No   OB History    Gravida  Para Term Preterm AB TAB SAB Ectopic Multiple Living   Review of Systems  Constitutional: Negative for fever and appetite change.  HENT: Positive for dental problem. Negative for congestion, facial swelling, sore throat and trouble swallowing.   Eyes: Negative for pain and visual disturbance.  Gastrointestinal: Negative for nausea and vomiting.  Musculoskeletal: Negative for neck pain and neck stiffness.  Neurological: Negative for dizziness, facial asymmetry and headaches.  Hematological: Negative for adenopathy.  All other systems reviewed and are negative.   Allergies  Amoxicillin; Erythrocin; Penicillins; and Wellbutrin  Home Medications   Prior to Admission medications   Medication Sig Start Date End Date Taking? Authorizing Provider  acetaminophen (TYLENOL) 500 MG tablet Take 1,000 mg by mouth every 6 (six) hours as needed for mild pain.   Yes Historical Provider, MD  ibuprofen (ADVIL,MOTRIN) 200 MG tablet Take 800 mg by mouth every 6 (six) hours as needed for moderate pain.   Yes Historical Provider, MD  naproxen sodium (ALEVE) 220 MG tablet Take 440 mg by mouth 2 (two) times daily as needed (dental pain).   Yes Historical Provider, MD  ibuprofen (ADVIL,MOTRIN) 600 MG tablet Take 1 tablet (600 mg total) by mouth every 6 (six) hours as needed for mild pain. Patient not taking: Reported on 03/15/2015 10/23/13  Nigel BridgemanVicki Latham, CNM  oxyCODONE-acetaminophen (PERCOCET/ROXICET) 5-325 MG per tablet Take 1-2 tablets by mouth every 4 (four) hours as needed for severe pain (moderate - severe pain). Patient not taking: Reported on 03/15/2015 10/23/13   Nigel BridgemanVicki Latham, CNM   Triage Vitals: BP 135/65 mmHg  Pulse 102  Temp(Src) 97.8 F (36.6 C) (Oral)  Resp 18  Ht 5\' 2"  (1.575 m)  Wt 145 lb (65.772 kg)  BMI 26.51 kg/m2  SpO2 100%  LMP 02/16/2015 Physical Exam  Constitutional: She is oriented to person, place, and time. She appears well-developed and well-nourished.  HENT:   Head: Normocephalic and atraumatic.  Mouth/Throat: Uvula is midline, oropharynx is clear and moist and mucous membranes are normal. No trismus in the jaw. No dental abscesses.  Tenderness and dental carries to left upper second molar. Slightly impacted left lower third molar. No abscess or trismus. No facial edema.  Eyes: EOM are normal.  Neck: Normal range of motion.  Cardiovascular: Normal rate, regular rhythm and normal heart sounds.  Exam reveals no gallop and no friction rub.   No murmur heard. Pulmonary/Chest: Effort normal and breath sounds normal. No respiratory distress.  Musculoskeletal: Normal range of motion.  Neurological: She is alert and oriented to person, place, and time.  Skin: Skin is warm and dry.  Psychiatric: She has a normal mood and affect. Her behavior is normal.  Nursing note and vitals reviewed.   ED Course  Procedures (including critical care time) DIAGNOSTIC STUDIES: Oxygen Saturation is 100% on RA, normal by my interpretation.   COORDINATION OF CARE: 1:41 PM- Will prescribe antibiotics and pain medication. Will give resources for dentist. Pt verbalizes understanding and agrees to plan.  Medications - No data to display  Labs Review Labs Reviewed - No data to display  Imaging Review No results found.   EKG Interpretation None      MDM   Final diagnoses:  Pain, dental   Airway patent.  No concerning sx's for Ludwig's angina.    i personally performed the services described in this documentation, which was scribed in my presence. The recorded information has been reviewed and is accurate.    Pauline Ausammy Kc Summerson, PA-C 03/17/15 2151  Linwood DibblesJon Knapp, MD 03/18/15 [phone]

## 2015-06-10 ENCOUNTER — Emergency Department (HOSPITAL_COMMUNITY)
Admission: EM | Admit: 2015-06-10 | Discharge: 2015-06-10 | Disposition: A | Discharge status: Home / Self Care | Payer: Self-pay | Attending: Emergency Medicine | Admitting: Emergency Medicine

## 2015-06-10 ENCOUNTER — Encounter (HOSPITAL_COMMUNITY): Payer: Self-pay | Admitting: Emergency Medicine

## 2015-06-10 DIAGNOSIS — K088 Other specified disorders of teeth and supporting structures: Secondary | ICD-10-CM | POA: Insufficient documentation

## 2015-06-10 DIAGNOSIS — K029 Dental caries, unspecified: Secondary | ICD-10-CM | POA: Insufficient documentation

## 2015-06-10 DIAGNOSIS — Z880 Allergy status to penicillin: Secondary | ICD-10-CM | POA: Insufficient documentation

## 2015-06-10 DIAGNOSIS — Z8659 Personal history of other mental and behavioral disorders: Secondary | ICD-10-CM | POA: Insufficient documentation

## 2015-06-10 DIAGNOSIS — Z720 Tobacco use: Secondary | ICD-10-CM | POA: Insufficient documentation

## 2015-06-10 DIAGNOSIS — Z87448 Personal history of other diseases of urinary system: Secondary | ICD-10-CM | POA: Insufficient documentation

## 2015-06-10 DIAGNOSIS — K0381 Cracked tooth: Secondary | ICD-10-CM | POA: Insufficient documentation

## 2015-06-10 DIAGNOSIS — K0889 Other specified disorders of teeth and supporting structures: Secondary | ICD-10-CM

## 2015-06-10 DIAGNOSIS — Z791 Long term (current) use of non-steroidal anti-inflammatories (NSAID): Secondary | ICD-10-CM | POA: Insufficient documentation

## 2015-06-10 MED ORDER — NAPROXEN 500 MG PO TABS: 500.0000 mg | ORAL_TABLET | Freq: Two times a day (BID) | ORAL | Status: DC

## 2015-06-10 MED ORDER — CLINDAMYCIN HCL 150 MG PO CAPS: 300.0000 mg | ORAL_CAPSULE | Freq: Four times a day (QID) | ORAL | Status: DC

## 2015-06-10 NOTE — Discharge Instructions (Signed)
Dental Pain  Toothache is pain in or around a tooth. It may get worse with chewing or with cold or heat.   HOME CARE  · Your dentist may use a numbing medicine during treatment. If so, you may need to avoid eating until the medicine wears off. Ask your dentist about this.  · Only take medicine as told by your dentist or doctor.  · Avoid chewing food near the painful tooth until after all treatment is done. Ask your dentist about this.  GET HELP RIGHT AWAY IF:   · The problem gets worse or new problems appear.  · You have a fever.  · There is redness and puffiness (swelling) of the face, jaw, or neck.  · You cannot open your mouth.  · There is pain in the jaw.  · There is very bad pain that is not helped by medicine.  MAKE SURE YOU:   · Understand these instructions.  · Will watch your condition.  · Will get help right away if you are not doing well or get worse.  Document Released: 02/15/2008 Document Revised: 11/21/2011 Document Reviewed: 02/15/2008  Exit[employer]® Patient Information ©2015 [employer]. This information is not intended to replace advice given to you by your health care provider. Make sure you discuss any questions you have with your health care provider.

## 2015-06-10 NOTE — ED Notes (Signed)
Pt states that she has been having dental pain for about 2 months since she broke a tooth - is also noticing an odor - Also co wisdom tooth problems

## 2015-06-10 NOTE — ED Provider Notes (Signed)
CSN: [id number]     Arrival date & time 06/10/15  1746 History   First MD Initiated Contact with Patient 06/10/15 1755     Chief Complaint  Patient presents with  . Dental Pain     (Consider location/radiation/quality/duration/timing/severity/associated sxs/prior Treatment) HPI   [place] D Duhe is a 24 y.o. female who presents to the Emergency Department complaining of left upper dental pain for 2 months.  Pain worsening for 1-2 weeks.  She was seen here in July for same and states that pain improved somewhat while taking antibiotics but returned.  She also states that her "back tooth" has broken off recently.  She reports a sharp throbbing pain to her left face that radiates to her ear and cheek.  She has not followed up with a dentist due to a lack of funds.  She has been taking OTC analgesics without relief.  She denies facial swelling, neck pain, fever, difficulty swallowing or breathing.     Past Medical History  Diagnosis Date  . Anxiety   . Depression     hx of PPD  . Pyelonephritis   . LGA (large for gestational age) fetus     with previous pregnancy  . Rh negative, maternal   . Spina bifida in child of prior pregnancy, currently pregnant   . Previous cesarean section     x2   Past Surgical History  Procedure Laterality Date  . Tonsillectomy    . Cesarean section  x2  . Adenoidectomy    . Cesarean section with bilateral tubal ligation Bilateral 10/21/2013    Procedure: PRIMARY CESAREAN SECTION WITH BILATERAL TUBAL LIGATION;  Surgeon: Esmeralda Arthur, MD;  Location: [facility];  Service: Obstetrics;  Laterality: Bilateral;  filshie clips   History reviewed. No pertinent family history. Social History  Substance Use Topics  . Smoking status: Current Every Day Smoker -- 0.75 packs/day for 5 years    Types: Cigarettes  . Smokeless tobacco: Never Used  . Alcohol Use: No   OB History    Gravida Para Term Preterm AB TAB SAB Ectopic Multiple Living   Review of Systems  Constitutional: Negative for fever and appetite change.  HENT: Positive for dental problem. Negative for congestion, facial swelling, sore throat and trouble swallowing.   Eyes: Negative for pain and visual disturbance.  Musculoskeletal: Negative for neck pain and neck stiffness.  Neurological: Negative for dizziness, facial asymmetry and headaches.  Hematological: Negative for adenopathy.  All other systems reviewed and are negative.     Allergies  Amoxicillin; Erythrocin; Penicillins; and Wellbutrin  Home Medications   Prior to Admission medications   Medication Sig Start Date End Date Taking? Authorizing Lamia Mariner  acetaminophen (TYLENOL) 500 MG tablet Take 1,000 mg by mouth every 6 (six) hours as needed for mild pain.    Historical Amyre Segundo, MD  clindamycin (CLEOCIN) 150 MG capsule Take 2 capsules (300 mg total) by mouth 4 (four) times daily. For 7 days 03/15/15   Tammy Triplett, PA-C  ibuprofen (ADVIL,MOTRIN) 200 MG tablet Take 800 mg by mouth every 6 (six) hours as needed for moderate pain.    Historical Johanan Skorupski, MD  ibuprofen (ADVIL,MOTRIN) 600 MG tablet Take 1 tablet (600 mg total) by mouth every 6 (six) hours as needed for mild pain. Patient not taking: Reported on 03/15/2015 10/23/13   Nigel Bridgeman, CNM  naproxen (NAPROSYN) 500 MG tablet Take 1 tablet (500  mg total) by mouth 2 (two) times daily with a meal. 03/15/15   Tammy Triplett, PA-C  naproxen sodium (ALEVE) 220 MG tablet Take 440 mg by mouth 2 (two) times daily as needed (dental pain).    Historical Lynnda Wiersma, MD  oxyCODONE-acetaminophen (PERCOCET/ROXICET) 5-325 MG per tablet Take 1-2 tablets by mouth every 4 (four) hours as needed for severe pain (moderate - severe pain). Patient not taking: Reported on 03/15/2015 10/23/13   Nigel Bridgeman, CNM   BP 121/74 mmHg  Pulse 103  Temp(Src) 98.1 F (36.7 C) (Oral)  Resp 16  Ht  (1.575 m)  Wt 150 lb (68.04 kg)  BMI 27.43 kg/m2  SpO2 100%  LMP 06/06/2015  (Exact Date)  Breastfeeding? No Physical Exam  Constitutional: She is oriented to person, place, and time. She appears well-developed and well-nourished. No distress.  HENT:  Head: Normocephalic and atraumatic.  Right Ear: Tympanic membrane and ear canal normal.  Left Ear: Tympanic membrane and ear canal normal.  Mouth/Throat: Uvula is midline, oropharynx is clear and moist and mucous membranes are normal. No trismus in the jaw. Dental caries present. No dental abscesses or uvula swelling.  ttp and dental caries of the left upper second and third molar.  No facial swelling, obvious dental abscess, trismus, or sublingual abnml.    Neck: Normal range of motion. Neck supple.  Cardiovascular: Normal rate, regular rhythm and normal heart sounds.   No murmur heard. Pulmonary/Chest: Effort normal and breath sounds normal. No respiratory distress.  Musculoskeletal: Normal range of motion.  Lymphadenopathy:    She has no cervical adenopathy.  Neurological: She is alert and oriented to person, place, and time. She exhibits normal muscle tone. Coordination normal.  Skin: Skin is warm and dry.  Nursing note and vitals reviewed.   ED Course  Procedures (including critical care time)   MDM   Final diagnoses:  Pain, dental    Pt is well appearing, non-toxic.  Vitals stable.  No concerning sx's for Ludwig's angina.  Pt seen here in July for same.  No dental f/u.  rx's for clinda and naprosyn.  I have advised her of importance of dental f/u and given referral info.  She agrees to plan and appears stable for d/c    Pauline Aus, PA-C 06/10/15 2057  Glynn Octave, MD 06/10/15 [phone]

## 2015-08-07 ENCOUNTER — Emergency Department (HOSPITAL_COMMUNITY): Payer: Medicaid Other

## 2015-08-07 ENCOUNTER — Encounter (HOSPITAL_COMMUNITY): Payer: Self-pay

## 2015-08-07 ENCOUNTER — Emergency Department (HOSPITAL_COMMUNITY)
Admission: EM | Admit: 2015-08-07 | Discharge: 2015-08-07 | Disposition: A | Discharge status: Home / Self Care | Payer: Medicaid Other | Attending: Emergency Medicine | Admitting: Emergency Medicine

## 2015-08-07 DIAGNOSIS — H578 Other specified disorders of eye and adnexa: Secondary | ICD-10-CM | POA: Diagnosis not present

## 2015-08-07 DIAGNOSIS — J209 Acute bronchitis, unspecified: Secondary | ICD-10-CM | POA: Insufficient documentation

## 2015-08-07 DIAGNOSIS — F17210 Nicotine dependence, cigarettes, uncomplicated: Secondary | ICD-10-CM | POA: Diagnosis not present

## 2015-08-07 DIAGNOSIS — Z87448 Personal history of other diseases of urinary system: Secondary | ICD-10-CM | POA: Insufficient documentation

## 2015-08-07 DIAGNOSIS — Z8659 Personal history of other mental and behavioral disorders: Secondary | ICD-10-CM | POA: Diagnosis not present

## 2015-08-07 DIAGNOSIS — R05 Cough: Secondary | ICD-10-CM | POA: Diagnosis present

## 2015-08-07 MED ORDER — ALBUTEROL SULFATE HFA 108 (90 BASE) MCG/ACT IN AERS
2.0000 | INHALATION_SPRAY | RESPIRATORY_TRACT | Status: DC | PRN
  Filled 2015-08-07: qty 6.7

## 2015-08-07 MED ORDER — CETIRIZINE-PSEUDOEPHEDRINE ER 5-120 MG PO TB12: 1.0000 | ORAL_TABLET | Freq: Two times a day (BID) | ORAL | Status: DC

## 2015-08-07 MED ORDER — BENZONATATE 100 MG PO CAPS: 200.0000 mg | ORAL_CAPSULE | Freq: Three times a day (TID) | ORAL | Status: DC | PRN

## 2015-08-07 MED ORDER — BENZONATATE 100 MG PO CAPS
200.0000 mg | ORAL_CAPSULE | Freq: Once | ORAL | Status: AC
  Administered 2015-08-07: 200 mg via ORAL
  Filled 2015-08-07: qty 2

## 2015-08-07 NOTE — Discharge Instructions (Signed)
Acute Bronchitis Bronchitis is inflammation of the airways that extend from the windpipe into the lungs (bronchi). The inflammation often causes mucus to develop. This leads to a cough, which is the most common symptom of bronchitis.  In acute bronchitis, the condition usually develops suddenly and goes away over time, usually in a couple weeks. Smoking, allergies, and asthma can make bronchitis worse. Repeated episodes of bronchitis may cause further lung problems.  CAUSES Acute bronchitis is most often caused by the same virus that causes a cold. The virus can spread from person to person (contagious) through coughing, sneezing, and touching contaminated objects. SIGNS AND SYMPTOMS   Cough.   Fever.   Coughing up mucus.   Body aches.   Chest congestion.   Chills.   Shortness of breath.   Sore throat.  DIAGNOSIS  Acute bronchitis is usually diagnosed through a physical exam. Your health care provider will also ask you questions about your medical history. Tests, such as chest X-rays, are sometimes done to rule out other conditions.  TREATMENT  Acute bronchitis usually goes away in a couple weeks. Oftentimes, no medical treatment is necessary. Medicines are sometimes given for relief of fever or cough. Antibiotic medicines are usually not needed but may be prescribed in certain situations. In some cases, an inhaler may be recommended to help reduce shortness of breath and control the cough. A cool mist vaporizer may also be used to help thin bronchial secretions and make it easier to clear the chest.  HOME CARE INSTRUCTIONS  Get plenty of rest.   Drink enough fluids to keep your urine clear or pale yellow (unless you have a medical condition that requires fluid restriction). Increasing fluids may help thin your respiratory secretions (sputum) and reduce chest congestion, and it will prevent dehydration.   Take medicines only as directed by your health care provider.  If  you were prescribed an antibiotic medicine, finish it all even if you start to feel better.  Avoid smoking and secondhand smoke. Exposure to cigarette smoke or irritating chemicals will make bronchitis worse. If you are a smoker, consider using nicotine gum or skin patches to help control withdrawal symptoms. Quitting smoking will help your lungs heal faster.   Reduce the chances of another bout of acute bronchitis by washing your hands frequently, avoiding people with cold symptoms, and trying not to touch your hands to your mouth, nose, or eyes.   Keep all follow-up visits as directed by your health care provider.  SEEK MEDICAL CARE IF: Your symptoms do not improve after 1 week of treatment.  SEEK IMMEDIATE MEDICAL CARE IF:  You develop an increased fever or chills.   You have chest pain.   You have severe shortness of breath.  You have bloody sputum.   You develop dehydration.  You faint or repeatedly feel like you are going to pass out.  You develop repeated vomiting.  You develop a severe headache. MAKE SURE YOU:   Understand these instructions.  Will watch your condition.  Will get help right away if you are not doing well or get worse.   This information is not intended to replace advice given to you by your health care provider. Make sure you discuss any questions you have with your health care provider.   Document Released: 10/06/2004 Document Revised: 09/19/2014 Document Reviewed: 02/19/2013 Elsevier Interactive Patient Education 2016 [employer].    You may take 2 puffs of your inhaler every 4 hours if you are coughing or  wheezing.  Use the Tessalon to help with cough suppression, Zyrtec-D to help you with your allergy symptoms.  Rest and make sure you are drinking plenty of fluids

## 2015-08-07 NOTE — ED Notes (Signed)
I have a cold that has been here for 2 weeks and it feels like it is my chest. My left eye was swollen the other day and it feels like there is a bump in it.

## 2015-08-07 NOTE — ED Provider Notes (Signed)
CSN: [id number]     Arrival date & time 08/07/15  2014 History   First MD Initiated Contact with Patient 08/07/15 2031     Chief Complaint  Patient presents with  . URI     (Consider location/radiation/quality/duration/timing/severity/associated sxs/prior Treatment) The history is provided by the patient.   Lynn Nolan is a 24 y.o. female presenting with a 2 week history of nonproductive cough along with mid sternal chest pain with coughing, nasal congestion and itchy eyes associated with clear tears.  She also experiences wheezing, mostly at night and not currently, mildly associated with sob.  She had a swelling on her left upper eyelid several days ago which has drained and is now less swollen but still feels a "bump" in her upper lid.   She denies fevers or chills, has had generalized fatigue.  Denies nausea, vomiting or diarrhea, denies abdominal pain.  The patient has taken dayquill and alka seltzer cold formula with transient improvement in symptoms.      Past Medical History  Diagnosis Date  . Anxiety   . Depression     hx of PPD  . Pyelonephritis   . LGA (large for gestational age) fetus     with previous pregnancy  . Rh negative, maternal   . Spina bifida in child of prior pregnancy, currently pregnant   . Previous cesarean section     x2   Past Surgical History  Procedure Laterality Date  . Tonsillectomy    . Cesarean section  x2  . Adenoidectomy    . Cesarean section with bilateral tubal ligation Bilateral 10/21/2013    Procedure: PRIMARY CESAREAN SECTION WITH BILATERAL TUBAL LIGATION;  Surgeon: Esmeralda Arthur, MD;  Location: [facility];  Service: Obstetrics;  Laterality: Bilateral;  filshie clips   No family history on file. Social History  Substance Use Topics  . Smoking status: Current Every Day Smoker -- 0.75 packs/day for 5 years    Types: Cigarettes  . Smokeless tobacco: Never Used  . Alcohol Use: No   OB History    Gravida Para Term Preterm AB  TAB SAB Ectopic Multiple Living   Review of Systems  Constitutional: Negative for fever and chills.  HENT: Positive for congestion, rhinorrhea and sore throat. Negative for ear discharge, ear pain, sinus pressure, trouble swallowing and voice change.   Eyes: Positive for itching. Negative for discharge.  Respiratory: Positive for cough and wheezing. Negative for shortness of breath and stridor.   Cardiovascular: Negative for chest pain.  Gastrointestinal: Negative for abdominal pain.  Genitourinary: Negative.   Musculoskeletal: Negative.   Skin: Negative.       Allergies  Amoxicillin; Erythrocin; Penicillins; and Wellbutrin  Home Medications   Prior to Admission medications   Medication Sig Start Date End Date Taking? Authorizing Provider  Phenylephrine-Ibuprofen (CONGESTION RELIEF) 10-200 MG TABS Take 1-2 tablets by mouth daily as needed (for congestion relief).   Yes Historical Provider, MD  benzonatate (TESSALON) 100 MG capsule Take 2 capsules (200 mg total) by mouth 3 (three) times daily as needed. 08/07/15   Burgess Amor, PA-C  cetirizine-pseudoephedrine (ZYRTEC-D) 5-120 MG tablet Take 1 tablet by mouth 2 (two) times daily. 08/07/15   Burgess Amor, PA-C  clindamycin (CLEOCIN) 150 MG capsule Take 2 capsules (300 mg total) by mouth 4 (four) times daily. Patient not taking: Reported on 08/07/2015 06/10/15   Tammy Triplett, PA-C  naproxen (NAPROSYN) 500  MG tablet Take 1 tablet (500 mg total) by mouth 2 (two) times daily with a meal. Patient not taking: Reported on 08/07/2015 06/10/15   Tammy Triplett, PA-C   BP 97/69 mmHg  Pulse 88  Temp(Src) 98.2 F (36.8 C) (Oral)  Resp 16  Ht 5\' 2"  (1.575 m)  Wt 68.04 kg  BMI 27.43 kg/m2  SpO2 100%  LMP 07/27/2015 (Exact Date)  Breastfeeding? No Physical Exam  Constitutional: She is oriented to person, place, and time. She appears well-developed and well-nourished.  HENT:  Head: Normocephalic and atraumatic.  Right  Ear: Tympanic membrane and ear canal normal.  Left Ear: Tympanic membrane and ear canal normal.  Nose: Mucosal edema present. No rhinorrhea. Right sinus exhibits no maxillary sinus tenderness and no frontal sinus tenderness. Left sinus exhibits no maxillary sinus tenderness and no frontal sinus tenderness.  Mouth/Throat: Uvula is midline, oropharynx is clear and moist and mucous membranes are normal. No oropharyngeal exudate, posterior oropharyngeal edema, posterior oropharyngeal erythema or tonsillar abscesses.  Eyes: Conjunctivae are normal.  Cardiovascular: Normal rate and normal heart sounds.   Pulmonary/Chest: Effort normal. No respiratory distress. She has no decreased breath sounds. She has no wheezes. She has no rhonchi. She has no rales.  Coarse breath sounds.  No wheezing currently.  Abdominal: Soft. There is no tenderness.  Musculoskeletal: Normal range of motion.  Neurological: She is alert and oriented to person, place, and time.  Skin: Skin is warm and dry. No rash noted.  Psychiatric: She has a normal mood and affect.    ED Course  Procedures (including critical care time) Labs Review Labs Reviewed - No data to display  Imaging Review Dg Chest 2 View  08/07/2015  CLINICAL DATA:  Cough EXAM: CHEST  2 VIEW COMPARISON:  06/08/2012 FINDINGS: The heart size and mediastinal contours are within normal limits. Both lungs are clear. The visualized skeletal structures are unremarkable. IMPRESSION: No active cardiopulmonary disease. Electronically Signed   By: Natasha MeadLiviu  Pop M.D.   On: 08/07/2015 21:36   I have personally reviewed and evaluated these images and lab results as part of my medical decision-making.   EKG Interpretation None      MDM   Final diagnoses:  Acute bronchitis, unspecified organism      Radiological studies were viewed, interpreted and considered during the medical decision making and disposition process. I agree with radiologists reading.  Results were  also discussed with patient.  Patient with acute bronchitis.  She was prescribed Tessalon, albuterol MDI with spacer given.  Zyrtec-D for sneezing and allergy symptoms.  Plan follow-up for any worsened or persistent symptoms.     Burgess AmorJulie Emyah Roznowski, PA-C 08/07/15 2203  Glynn OctaveStephen Rancour, MD 08/08/15 [phone]

## 2015-11-04 ENCOUNTER — Emergency Department (HOSPITAL_COMMUNITY)
Admission: EM | Admit: 2015-11-04 | Discharge: 2015-11-04 | Disposition: A | Discharge status: Home / Self Care | Payer: Medicaid Other | Attending: Emergency Medicine | Admitting: Emergency Medicine

## 2015-11-04 ENCOUNTER — Encounter (HOSPITAL_COMMUNITY): Payer: Self-pay | Admitting: Emergency Medicine

## 2015-11-04 DIAGNOSIS — H65192 Other acute nonsuppurative otitis media, left ear: Secondary | ICD-10-CM | POA: Diagnosis not present

## 2015-11-04 DIAGNOSIS — Z880 Allergy status to penicillin: Secondary | ICD-10-CM | POA: Insufficient documentation

## 2015-11-04 DIAGNOSIS — Z79899 Other long term (current) drug therapy: Secondary | ICD-10-CM | POA: Insufficient documentation

## 2015-11-04 DIAGNOSIS — F17210 Nicotine dependence, cigarettes, uncomplicated: Secondary | ICD-10-CM | POA: Diagnosis not present

## 2015-11-04 DIAGNOSIS — Z8659 Personal history of other mental and behavioral disorders: Secondary | ICD-10-CM | POA: Insufficient documentation

## 2015-11-04 DIAGNOSIS — Z87448 Personal history of other diseases of urinary system: Secondary | ICD-10-CM | POA: Insufficient documentation

## 2015-11-04 DIAGNOSIS — R0981 Nasal congestion: Secondary | ICD-10-CM | POA: Diagnosis present

## 2015-11-04 DIAGNOSIS — R05 Cough: Secondary | ICD-10-CM | POA: Insufficient documentation

## 2015-11-04 MED ORDER — CEPHALEXIN 500 MG PO CAPS: 500.0000 mg | ORAL_CAPSULE | Freq: Four times a day (QID) | ORAL | Status: DC

## 2015-11-04 MED ORDER — GUAIFENESIN-CODEINE 100-10 MG/5ML PO SYRP: 10.0000 mL | ORAL_SOLUTION | Freq: Three times a day (TID) | ORAL | Status: DC | PRN

## 2015-11-04 NOTE — ED Notes (Signed)
Pt c/o cough/congestion x 2 weeks with sinus pain/pressure.

## 2015-11-04 NOTE — Discharge Instructions (Signed)
Otitis Media, Adult °Otitis media is redness, soreness, and puffiness (swelling) in the space just behind your eardrum (middle ear). It may be caused by allergies or infection. It often happens along with a cold. °HOME CARE °· Take your medicine as told. Finish it even if you start to feel better. °· Only take over-the-counter or prescription medicines for pain, discomfort, or fever as told by your doctor. °· Follow up with your doctor as told. °GET HELP IF: °· You have otitis media only in one ear, or bleeding from your nose, or both. °· You notice a lump on your neck. °· You are not getting better in 3-5 days. °· You feel worse instead of better. °GET HELP RIGHT AWAY IF:  °· You have pain that is not helped with medicine. °· You have puffiness, redness, or pain around your ear. °· You get a stiff neck. °· You cannot move part of your face (paralysis). °· You notice that the bone behind your ear hurts when you touch it. °MAKE SURE YOU:  °· Understand these instructions. °· Will watch your condition. °· Will get help right away if you are not doing well or get worse. °  °This information is not intended to replace advice given to you by your health care provider. Make sure you discuss any questions you have with your health care provider. °  °Document Released: 02/15/2008 Document Revised: 09/19/2014 Document Reviewed: 03/26/2013 °Elsevier Interactive Patient Education ©2016 Elsevier Inc[employer] ° ° °

## 2015-11-04 NOTE — ED Notes (Signed)
Tammy - PA in triage for evaluation of patient.

## 2015-11-06 NOTE — ED Provider Notes (Signed)
CSN: [id number]     Arrival date & time 11/04/15  1802 History   First MD Initiated Contact with Patient 11/04/15 1830     Chief Complaint  Patient presents with  . Nasal Congestion     (Consider location/radiation/quality/duration/timing/severity/associated sxs/prior Treatment) HPI   Lynn Nolan is a 25 y.o. female who presents to the Emergency Department complaining of left ear pain, cough and nasal congestion for 2 weeks.  states symptoms began as congestion.  She has tried OTC cough and cold medication without relief.  She denies fever, headaches, neck pain, dizziness or decreased hearing.     Past Medical History  Diagnosis Date  . Anxiety   . Depression     hx of PPD  . Pyelonephritis   . LGA (large for gestational age) fetus     with previous pregnancy  . Rh negative, maternal   . Spina bifida in child of prior pregnancy, currently pregnant   . Previous cesarean section     x2   Past Surgical History  Procedure Laterality Date  . Tonsillectomy    . Cesarean section  x2  . Adenoidectomy    . Cesarean section with bilateral tubal ligation Bilateral 10/21/2013    Procedure: PRIMARY CESAREAN SECTION WITH BILATERAL TUBAL LIGATION;  Surgeon: Esmeralda Arthur, MD;  Location: [facility];  Service: Obstetrics;  Laterality: Bilateral;  filshie clips   History reviewed. No pertinent family history. Social History  Substance Use Topics  . Smoking status: Current Every Day Smoker -- 0.75 packs/day for 5 years    Types: Cigarettes  . Smokeless tobacco: Never Used  . Alcohol Use: No   OB History    Gravida Para Term Preterm AB TAB SAB Ectopic Multiple Living   Review of Systems  Constitutional: Negative for fever, chills, activity change and appetite change.  HENT: Positive for congestion and ear pain. Negative for facial swelling, rhinorrhea, sore throat and trouble swallowing.   Eyes: Negative for visual disturbance.  Respiratory: Positive for  cough. Negative for shortness of breath, wheezing and stridor.   Gastrointestinal: Negative for nausea and vomiting.  Musculoskeletal: Negative for neck pain and neck stiffness.  Skin: Negative.   Neurological: Negative for dizziness, weakness, numbness and headaches.  Hematological: Negative for adenopathy.  Psychiatric/Behavioral: Negative for confusion.  All other systems reviewed and are negative.     Allergies  Amoxicillin; Erythrocin; Penicillins; and Wellbutrin  Home Medications   Prior to Admission medications   Medication Sig Start Date End Date Taking? Authorizing Provider  benzonatate (TESSALON) 100 MG capsule Take 2 capsules (200 mg total) by mouth 3 (three) times daily as needed. 08/07/15   Burgess Amor, PA-C  cephALEXin (KEFLEX) 500 MG capsule Take 1 capsule (500 mg total) by mouth 4 (four) times daily. For 7 days 11/04/15   Annabella Elford, PA-C  cetirizine-pseudoephedrine (ZYRTEC-D) 5-120 MG tablet Take 1 tablet by mouth 2 (two) times daily. 08/07/15   Burgess Amor, PA-C  clindamycin (CLEOCIN) 150 MG capsule Take 2 capsules (300 mg total) by mouth 4 (four) times daily. Patient not taking: Reported on 08/07/2015 06/10/15   Giovannina Mun, PA-C  guaiFENesin-codeine (ROBITUSSIN AC) 100-10 MG/5ML syrup Take 10 mLs by mouth 3 (three) times daily as needed. 11/04/15   Ayaz Sondgeroth, PA-C  naproxen (NAPROSYN) 500 MG tablet Take 1 tablet (500 mg total) by mouth 2 (two) times daily with a meal. Patient not taking:  Reported on 08/07/2015 06/10/15   Chief Walkup, PA-C  Phenylephrine-Ibuprofen (CONGESTION RELIEF) 10-200 MG TABS Take 1-2 tablets by mouth daily as needed (for congestion relief).    Historical Provider, MD   BP 116/70 mmHg  Pulse 92  Temp(Src) 99.1 F (37.3 C)  Resp 18  Ht  (1.575 m)  Wt 68.04 kg  BMI 27.43 kg/m2  SpO2 100%  LMP 09/26/2015 Physical Exam  Constitutional: She is oriented to person, place, and time. She appears well-developed and well-nourished.  No distress.  HENT:  Head: Normocephalic and atraumatic.  Mouth/Throat: Uvula is midline, oropharynx is clear and moist and mucous membranes are normal. No uvula swelling. No oropharyngeal exudate.  Erythema of the left TM.  Loss of landmarks.  No drainage or edema of the ear canal, TM appears intact.    Neck: Normal range of motion. Neck supple.  Cardiovascular: Normal rate, regular rhythm, normal heart sounds and intact distal pulses.   No murmur heard. Pulmonary/Chest: Effort normal and breath sounds normal. No stridor. No respiratory distress.  Lymphadenopathy:    She has no cervical adenopathy.  Neurological: She is alert and oriented to person, place, and time. Coordination normal.  Skin: Skin is warm and dry. No rash noted.  Nursing note and vitals reviewed.   ED Course  Procedures (including critical care time) Labs Review Labs Reviewed - No data to display  Imaging Review No results found. I have personally reviewed and evaluated these images and lab results as part of my medical decision-making.   EKG Interpretation None      MDM   Final diagnoses:  Acute nonsuppurative otitis media of left ear    Pt non-toxic appearing.  Vitals stable.  Agrees to tx plan with antibiotic and close PMD f/u.  Appears stable for d/c     Pauline Aus, PA-C 11/06/15 2304  Marily Memos, MD 11/07/15 1252

## 2016-03-07 ENCOUNTER — Ambulatory Visit (HOSPITAL_COMMUNITY)
Admission: EM | Admit: 2016-03-07 | Discharge: 2016-03-07 | Disposition: A | Discharge status: Home / Self Care | Payer: Medicaid Other | Attending: Family Medicine | Admitting: Family Medicine

## 2016-03-07 ENCOUNTER — Encounter (HOSPITAL_COMMUNITY): Payer: Self-pay | Admitting: Emergency Medicine

## 2016-03-07 DIAGNOSIS — K0889 Other specified disorders of teeth and supporting structures: Secondary | ICD-10-CM

## 2016-03-07 MED ORDER — DICLOFENAC POTASSIUM 50 MG PO TABS: 50.0000 mg | ORAL_TABLET | Freq: Three times a day (TID) | ORAL | Status: DC

## 2016-03-07 MED ORDER — CLINDAMYCIN HCL 300 MG PO CAPS: 300.0000 mg | ORAL_CAPSULE | Freq: Three times a day (TID) | ORAL | Status: DC

## 2016-03-07 NOTE — ED Provider Notes (Signed)
CSN: [id number]     Arrival date & time 03/07/16  1546 History   First MD Initiated Contact with Patient 03/07/16 1629     Chief Complaint  Patient presents with  . Dental Pain   (Consider location/radiation/quality/duration/timing/severity/associated sxs/prior Treatment) Patient is a 25 y.o. female presenting with tooth pain. The history is provided by the patient and a parent.  Dental Pain Location:  Upper Upper teeth location:  1/RU 3rd molar Quality:  Aching and throbbing Severity:  Moderate Onset quality:  Gradual Duration:  5 days Progression:  Worsening Chronicity:  New Context: dental caries and poor dentition   Associated symptoms: no congestion and no drooling     Past Medical History  Diagnosis Date  . Anxiety   . Depression     hx of PPD  . Pyelonephritis   . LGA (large for gestational age) fetus     with previous pregnancy  . Rh negative, maternal   . Spina bifida in child of prior pregnancy, currently pregnant   . Previous cesarean section     x2   Past Surgical History  Procedure Laterality Date  . Tonsillectomy    . Cesarean section  x2  . Adenoidectomy    . Cesarean section with bilateral tubal ligation Bilateral 10/21/2013    Procedure: PRIMARY CESAREAN SECTION WITH BILATERAL TUBAL LIGATION;  Surgeon: Esmeralda ArthurSandra A Rivard, MD;  Location: [facility];  Service: Obstetrics;  Laterality: Bilateral;  filshie clips   History reviewed. No pertinent family history. Social History  Substance Use Topics  . Smoking status: Current Every Day Smoker -- 0.75 packs/day for 5 years    Types: Cigarettes  . Smokeless tobacco: Never Used  . Alcohol Use: No   OB History    Gravida Para Term Preterm AB TAB SAB Ectopic Multiple Living   3 3 2 1     1 3      Review of Systems  Constitutional: Negative.   HENT: Positive for dental problem. Negative for congestion and drooling.   All other systems reviewed and are negative.   Allergies  Amoxicillin; Erythrocin;  Penicillins; and Wellbutrin  Home Medications   Prior to Admission medications   Medication Sig Start Date End Date Taking? Authorizing Provider  benzonatate (TESSALON) 100 MG capsule Take 2 capsules (200 mg total) by mouth 3 (three) times daily as needed. 08/07/15   Burgess AmorJulie Idol, PA-C  cephALEXin (KEFLEX) 500 MG capsule Take 1 capsule (500 mg total) by mouth 4 (four) times daily. For 7 days 11/04/15   Tammy Triplett, PA-C  cetirizine-pseudoephedrine (ZYRTEC-D) 5-120 MG tablet Take 1 tablet by mouth 2 (two) times daily. 08/07/15   Burgess AmorJulie Idol, PA-C  clindamycin (CLEOCIN) 300 MG capsule Take 1 capsule (300 mg total) by mouth 3 (three) times daily. 03/07/16   Linna HoffJames D Holiday Mcmenamin, MD  diclofenac (CATAFLAM) 50 MG tablet Take 1 tablet (50 mg total) by mouth 3 (three) times daily. For dental pain. 03/07/16   Linna HoffJames D Kandas Oliveto, MD  guaiFENesin-codeine (ROBITUSSIN AC) 100-10 MG/5ML syrup Take 10 mLs by mouth 3 (three) times daily as needed. 11/04/15   Tammy Triplett, PA-C  naproxen (NAPROSYN) 500 MG tablet Take 1 tablet (500 mg total) by mouth 2 (two) times daily with a meal. Patient not taking: Reported on 08/07/2015 06/10/15   Tammy Triplett, PA-C  Phenylephrine-Ibuprofen (CONGESTION RELIEF) 10-200 MG TABS Take 1-2 tablets by mouth daily as needed (for congestion relief).    Historical Provider, MD   Meds Ordered and Administered this Visit  Medications - No data to display  BP 125/69 mmHg  Pulse 92  Temp(Src) 98.3 F (36.8 C) (Oral)  Resp 16  SpO2 100%  LMP 02/23/2016 (Exact Date) No data found.   Physical Exam  Constitutional: She is oriented to person, place, and time. She appears well-developed and well-nourished. No distress.  HENT:  Mouth/Throat: Uvula is midline, oropharynx is clear and moist and mucous membranes are normal. Abnormal dentition. Dental caries present.    Eyes: Conjunctivae are normal. Pupils are equal, round, and reactive to light.  Neck: Normal range of motion. Neck supple.    Neurological: She is alert and oriented to person, place, and time.  Skin: Skin is warm and dry.  Nursing note and vitals reviewed.   ED Course  Procedures (including critical care time)  Labs Review Labs Reviewed - No data to display  Imaging Review No results found.   Visual Acuity Review  Right Eye Distance:   Left Eye Distance:   Bilateral Distance:    Right Eye Near:   Left Eye Near:    Bilateral Near:         MDM   1. Pain, dental        Linna HoffJames D Albany Winslow, MD 03/07/16 [phone]

## 2016-03-07 NOTE — Discharge Instructions (Signed)
Take medicine as prescribed, see a dentist as soon as possible

## 2016-03-07 NOTE — ED Notes (Signed)
[facility] with a complaint of dental pain related to her wisdom teeth that started 5 days prior.

## 2016-04-02 ENCOUNTER — Encounter (HOSPITAL_COMMUNITY): Payer: Self-pay | Admitting: Emergency Medicine

## 2016-04-02 ENCOUNTER — Emergency Department (HOSPITAL_COMMUNITY)
Admission: EM | Admit: 2016-04-02 | Discharge: 2016-04-02 | Disposition: A | Discharge status: Home / Self Care | Payer: Medicaid Other | Attending: Dermatology | Admitting: Dermatology

## 2016-04-02 DIAGNOSIS — Z5321 Procedure and treatment not carried out due to patient leaving prior to being seen by health care provider: Secondary | ICD-10-CM | POA: Diagnosis not present

## 2016-04-02 DIAGNOSIS — F17210 Nicotine dependence, cigarettes, uncomplicated: Secondary | ICD-10-CM | POA: Diagnosis not present

## 2016-04-02 DIAGNOSIS — N939 Abnormal uterine and vaginal bleeding, unspecified: Secondary | ICD-10-CM | POA: Diagnosis not present

## 2016-04-02 DIAGNOSIS — R110 Nausea: Secondary | ICD-10-CM | POA: Diagnosis not present

## 2016-04-02 DIAGNOSIS — F329 Major depressive disorder, single episode, unspecified: Secondary | ICD-10-CM | POA: Diagnosis not present

## 2016-04-02 NOTE — ED Notes (Signed)
Started with period  About 12 days ago, having clots when voiding and pressure.  C/o nausea for the hour 12 days but did not vomit.

## 2016-04-03 ENCOUNTER — Ambulatory Visit (HOSPITAL_COMMUNITY)
Admission: EM | Admit: 2016-04-03 | Discharge: 2016-04-03 | Disposition: A | Discharge status: Home / Self Care | Payer: Medicaid Other | Attending: Physician Assistant | Admitting: Physician Assistant

## 2016-04-03 ENCOUNTER — Encounter (HOSPITAL_COMMUNITY): Payer: Self-pay | Admitting: Emergency Medicine

## 2016-04-03 DIAGNOSIS — R109 Unspecified abdominal pain: Secondary | ICD-10-CM | POA: Diagnosis present

## 2016-04-03 DIAGNOSIS — Z9889 Other specified postprocedural states: Secondary | ICD-10-CM | POA: Diagnosis not present

## 2016-04-03 DIAGNOSIS — N939 Abnormal uterine and vaginal bleeding, unspecified: Secondary | ICD-10-CM | POA: Diagnosis present

## 2016-04-03 DIAGNOSIS — Z79899 Other long term (current) drug therapy: Secondary | ICD-10-CM | POA: Diagnosis not present

## 2016-04-03 DIAGNOSIS — Z880 Allergy status to penicillin: Secondary | ICD-10-CM | POA: Diagnosis not present

## 2016-04-03 DIAGNOSIS — N390 Urinary tract infection, site not specified: Secondary | ICD-10-CM | POA: Insufficient documentation

## 2016-04-03 LAB — POCT URINALYSIS DIP (DEVICE)
BILIRUBIN URINE: NEGATIVE
GLUCOSE, UA: NEGATIVE mg/dL
Ketones, ur: NEGATIVE mg/dL
Nitrite: POSITIVE — AB
PROTEIN: 30 mg/dL — AB
Specific Gravity, Urine: 1.02 (ref 1.005–1.030)
Urobilinogen, UA: 0.2 mg/dL (ref 0.0–1.0)
pH: 7 (ref 5.0–8.0)

## 2016-04-03 LAB — POCT PREGNANCY, URINE: PREG TEST UR: NEGATIVE

## 2016-04-03 MED ORDER — NITROFURANTOIN MONOHYD MACRO 100 MG PO CAPS: 100.0000 mg | ORAL_CAPSULE | Freq: Two times a day (BID) | ORAL | 0 refills | Status: DC

## 2016-04-03 NOTE — ED Triage Notes (Signed)
The patient presented to the [facility] with a complaint of abdominal pain and abnormal vaginal bleeding  For approximately 13 days ago.

## 2016-04-03 NOTE — ED Provider Notes (Signed)
CSN: [id number]     Arrival date & time 04/03/16  1759 History   First MD Initiated Contact with Patient 04/03/16 1830     Chief Complaint  Patient presents with  . Abdominal Pain  . Vaginal Bleeding   (Consider location/radiation/quality/duration/timing/severity/associated sxs/prior Treatment) HPI History obtained from patient:  Pt presents with the cc of:  Vaginal bleeding blood clots Duration of symptoms: 13 days Treatment prior to arrival: Went to the ER yesterday but did not stay due to long wait Context: Patient states that she had a period and now has blood clots and pink somewhat bloody urine no dysuria urgency or frequency. Other symptoms include: Feels uncomfortable when she urinates Pain score: 2 FAMILY HISTORY: Hypertension    Past Medical History:  Diagnosis Date  . Anxiety   . Depression    hx of PPD  . LGA (large for gestational age) fetus    with previous pregnancy  . Previous cesarean section    x2  . Pyelonephritis   . Rh negative, maternal   . Spina bifida in child of prior pregnancy, currently pregnant    Past Surgical History:  Procedure Laterality Date  . ADENOIDECTOMY    . CESAREAN SECTION  x2  . CESAREAN SECTION WITH BILATERAL TUBAL LIGATION Bilateral 10/21/2013   Procedure: PRIMARY CESAREAN SECTION WITH BILATERAL TUBAL LIGATION;  Surgeon: Esmeralda Arthur, MD;  Location: [facility];  Service: Obstetrics;  Laterality: Bilateral;  filshie clips  . TONSILLECTOMY     History reviewed. No pertinent family history. Social History  Substance Use Topics  . Smoking status: Current Every Day Smoker    Packs/day: 0.75    Years: 5.00    Types: Cigarettes  . Smokeless tobacco: Never Used  . Alcohol use No   OB History    Gravida Para Term Preterm AB Living   SAB TAB Ectopic Multiple Live Births         1       Review of Systems  Denies: HEADACHE, NAUSEA, ABDOMINAL PAIN, CHEST PAIN, CONGESTION, DYSURIA, SHORTNESS OF BREATH  Allergies   Amoxicillin; Erythrocin; Penicillins; and Wellbutrin [bupropion hcl]  Home Medications   Prior to Admission medications   Medication Sig Start Date End Date Taking? Authorizing Provider  benzonatate (TESSALON) 100 MG capsule Take 2 capsules (200 mg total) by mouth 3 (three) times daily as needed. 08/07/15   Burgess Amor, PA-C  cephALEXin (KEFLEX) 500 MG capsule Take 1 capsule (500 mg total) by mouth 4 (four) times daily. For 7 days 11/04/15   Tammy Triplett, PA-C  cetirizine-pseudoephedrine (ZYRTEC-D) 5-120 MG tablet Take 1 tablet by mouth 2 (two) times daily. 08/07/15   Burgess Amor, PA-C  clindamycin (CLEOCIN) 300 MG capsule Take 1 capsule (300 mg total) by mouth 3 (three) times daily. 03/07/16   Linna Hoff, MD  diclofenac (CATAFLAM) 50 MG tablet Take 1 tablet (50 mg total) by mouth 3 (three) times daily. For dental pain. 03/07/16   Linna Hoff, MD  guaiFENesin-codeine (ROBITUSSIN AC) 100-10 MG/5ML syrup Take 10 mLs by mouth 3 (three) times daily as needed. 11/04/15   Tammy Triplett, PA-C  naproxen (NAPROSYN) 500 MG tablet Take 1 tablet (500 mg total) by mouth 2 (two) times daily with a meal. Patient not taking: Reported on 08/07/2015 06/10/15   Tammy Triplett, PA-C  Phenylephrine-Ibuprofen (CONGESTION RELIEF) 10-200 MG TABS Take 1-2 tablets by mouth daily as needed (for congestion relief).  Historical Provider, MD   Meds Ordered and Administered this Visit  Medications - No data to display  BP 121/77 (BP Location: Right Arm)   Pulse 101   Temp 98 F (36.7 C) (Oral)   LMP 03/22/2016   SpO2 97%  No data found.   Physical Exam NURSES NOTES AND VITAL SIGNS REVIEWED. CONSTITUTIONAL: Well developed, well nourished, no acute distress HEENT: normocephalic, atraumatic EYES: Conjunctiva normal NECK:normal ROM, supple, no adenopathy PULMONARY:No respiratory distress, normal effort ABDOMINAL: Soft, ND, NT BS+, No CVAT MUSCULOSKELETAL: Normal ROM of all extremities,  SKIN: warm and dry  without rash PSYCHIATRIC: Mood and affect, behavior are normal NURSES NOTES AND VITAL SIGNS REVIEWED. CONSTITUTIONAL: Well developed, well nourished, no acute distress HEENT: normocephalic, atraumatic, right and left TM's are normal EYES: Conjunctiva normal NECK:normal ROM, supple, no adenopathy PULMONARY:No respiratory distress, normal effort Exam is performed with patient's permission Female nursing staff present to chaperone.  Perineum: clean, dry without lesions, no groin or inguinal Lymphadenopathy; urethra . No caruncle or prolapse noted.  Vaginal canal: moderate amount thick white adherent discharge noted in canal with loss of rugae. Scant amount thin homogenous white-yellow discharge in fornix.  Cervix is pink and non-friable.    Bi-manual: no pain on palpation of cervix or adnexal structures, ovaries not enlarged.   Urgent Care Course   Clinical Course  Value Comment By Time  Leukocytes, UA: (!) LARGE (Reviewed) Tharon Aquas, PA 07/23 1841  Nitrite: (!) POSITIVE (Reviewed) Tharon Aquas, PA 07/23 1841  Preg Test, Ur: NEGATIVE (Reviewed) Tharon Aquas, PA 07/23 1842    Procedures (including critical care time)  Labs Review Labs Reviewed  POCT URINALYSIS DIP (DEVICE) - Abnormal; Notable for the following:       Result Value   Hgb urine dipstick TRACE (*)    Protein, ur 30 (*)    Nitrite POSITIVE (*)    Leukocytes, UA LARGE (*)    All other components within normal limits  POCT PREGNANCY, URINE    Imaging Review No results found.   Visual Acuity Review  Right Eye Distance:   Left Eye Distance:   Bilateral Distance:    Right Eye Near:   Left Eye Near:    Bilateral Near:         MDM   1. Vaginal bleeding   2. UTI (lower urinary tract infection)     Patient is reassured that there are no issues that require transfer to higher level of care at this time or additional tests. Patient is advised to continue home symptomatic treatment. Patient is  advised that if there are new or worsening symptoms to attend the emergency department, contact primary care provider, or return to UC. Instructions of care provided discharged home in stable condition.    THIS NOTE WAS GENERATED USING A VOICE RECOGNITION SOFTWARE PROGRAM. ALL REASONABLE EFFORTS  WERE MADE TO PROOFREAD THIS DOCUMENT FOR ACCURACY.  I have verbally reviewed the discharge instructions with the patient. A printed AVS was given to the patient.  All questions were answered prior to discharge.      Tharon Aquas, PA 04/03/16 1919

## 2016-04-04 LAB — CERVICOVAGINAL ANCILLARY ONLY
CHLAMYDIA, DNA PROBE: NEGATIVE
NEISSERIA GONORRHEA: NEGATIVE

## 2016-04-05 LAB — CERVICOVAGINAL ANCILLARY ONLY: WET PREP (BD AFFIRM): NEGATIVE

## 2016-05-20 ENCOUNTER — Emergency Department (HOSPITAL_COMMUNITY)
Admission: EM | Admit: 2016-05-20 | Discharge: 2016-05-21 | Disposition: A | Discharge status: Home / Self Care | Payer: Medicaid Other | Attending: Emergency Medicine | Admitting: Emergency Medicine

## 2016-05-20 ENCOUNTER — Encounter (HOSPITAL_COMMUNITY): Payer: Self-pay

## 2016-05-20 DIAGNOSIS — S91331A Puncture wound without foreign body, right foot, initial encounter: Secondary | ICD-10-CM | POA: Insufficient documentation

## 2016-05-20 DIAGNOSIS — Z23 Encounter for immunization: Secondary | ICD-10-CM | POA: Insufficient documentation

## 2016-05-20 DIAGNOSIS — Y92007 Garden or yard of unspecified non-institutional (private) residence as the place of occurrence of the external cause: Secondary | ICD-10-CM | POA: Diagnosis not present

## 2016-05-20 DIAGNOSIS — Y999 Unspecified external cause status: Secondary | ICD-10-CM | POA: Diagnosis not present

## 2016-05-20 DIAGNOSIS — Y939 Activity, unspecified: Secondary | ICD-10-CM | POA: Diagnosis not present

## 2016-05-20 DIAGNOSIS — W450XXA Nail entering through skin, initial encounter: Secondary | ICD-10-CM | POA: Diagnosis not present

## 2016-05-20 DIAGNOSIS — F17210 Nicotine dependence, cigarettes, uncomplicated: Secondary | ICD-10-CM | POA: Diagnosis not present

## 2016-05-20 MED ORDER — SULFAMETHOXAZOLE-TRIMETHOPRIM 800-160 MG PO TABS: 1.0000 | ORAL_TABLET | Freq: Two times a day (BID) | ORAL | 0 refills | Status: AC

## 2016-05-20 MED ORDER — TETANUS-DIPHTH-ACELL PERTUSSIS 5-2.5-18.5 LF-MCG/0.5 IM SUSP
0.5000 mL | Freq: Once | INTRAMUSCULAR | Status: AC
  Administered 2016-05-20: 0.5 mL via INTRAMUSCULAR
  Filled 2016-05-20: qty 0.5

## 2016-05-20 NOTE — ED Triage Notes (Signed)
Puncture wound from nail to right heel. Needs TdaP

## 2016-05-20 NOTE — ED Notes (Signed)
Cleaned and dressed wound as per Burgess AmorJulie Idol

## 2016-05-20 NOTE — Discharge Instructions (Signed)
Wash your wound in warm sudsy water twice daily, dry then apply a fresh bandage until the site has healed.  Take the entire course of antibiotics.  Avoid weight bearing until pain is improved.

## 2016-05-21 NOTE — ED Provider Notes (Signed)
[facility] Provider Note   CSN: [id number] Arrival date & time: 05/20/16  2004     History   Chief Complaint Chief Complaint  Patient presents with  . Puncture Wound    HPI Lynn Nolan is a 25 y.o. female presenting requesting a tetanus shot after she accidentally was punctured in her right heel by a rusty nail when she stepped barefoot in her yard around 4 pm today.  She describes the nail went in laterally parallel to her skin and was not a deep puncture.  She easily removed the nail wholly with a small amount of bleeding which has resolved.  She has had no further treatment of this prior to arrival.  The history is provided by the patient.    Past Medical History:  Diagnosis Date  . Anxiety   . Depression    hx of PPD  . LGA (large for gestational age) fetus    with previous pregnancy  . Previous cesarean section    x2  . Pyelonephritis   . Rh negative, maternal   . Spina bifida in child of prior pregnancy, currently pregnant     Patient Active Problem List   Diagnosis Date Noted  . UTI (lower urinary tract infection) 04/03/2016  . Vaginal bleeding 04/03/2016  . Cesarean delivery delivered 10/21/2013  . Twin gestation, monochorionic monoamniotic 09/06/2013  . History of pyelonephritis 09/06/2013  . History of postpartum depression 09/06/2013  . History of cesarean delivery, currently pregnant 09/06/2013  . H/o LGA (large for gestational age) fetus 09/06/2013  . Rh negative status during pregnancy 09/06/2013  . Spina bifida, child of prior pregnancy, currently pregnant 09/06/2013    Past Surgical History:  Procedure Laterality Date  . ADENOIDECTOMY    . CESAREAN SECTION  x2  . CESAREAN SECTION WITH BILATERAL TUBAL LIGATION Bilateral 10/21/2013   Procedure: PRIMARY CESAREAN SECTION WITH BILATERAL TUBAL LIGATION;  Surgeon: Esmeralda Arthur, MD;  Location: [facility];  Service: Obstetrics;  Laterality: Bilateral;  filshie clips  . TONSILLECTOMY       OB History    Gravida Para Term Preterm AB Living   3 3 2 1   3    SAB TAB Ectopic Multiple Live Births         1 1       Home Medications    Prior to Admission medications   Medication Sig Start Date End Date Taking? Authorizing Provider  nitrofurantoin, macrocrystal-monohydrate, (MACROBID) 100 MG capsule Take 1 capsule (100 mg total) by mouth 2 (two) times daily. Patient not taking: Reported on 05/20/2016 04/03/16   Tharon Aquas, PA  sulfamethoxazole-trimethoprim (BACTRIM DS,SEPTRA DS) 800-160 MG tablet Take 1 tablet by mouth 2 (two) times daily. 05/20/16 05/27/16  Burgess Amor, PA-C    Family History History reviewed. No pertinent family history.  Social History Social History  Substance Use Topics  . Smoking status: Current Every Day Smoker    Packs/day: 0.75    Years: 5.00    Types: Cigarettes  . Smokeless tobacco: Never Used  . Alcohol use No     Allergies   Amoxicillin; Erythrocin; Penicillins; and Wellbutrin [bupropion hcl]   Review of Systems Review of Systems  Musculoskeletal: Negative for arthralgias.  Skin: Positive for wound.  Neurological: Negative for numbness.  All other systems reviewed and are negative.    Physical Exam Updated Vital Signs BP 110/76 (BP Location: Right Arm)   Pulse 82   Temp 98.7 F (37.1 C) (Oral)  Resp 19   Ht 5\' 2"  (1.575 m)   Wt 68 kg   LMP 04/12/2016   SpO2 99%   BMI 27.44 kg/m   Physical Exam  Constitutional: She is oriented to person, place, and time. She appears well-developed and well-nourished.  HENT:  Head: Normocephalic.  Cardiovascular: Normal rate.   Pulmonary/Chest: Effort normal.  Musculoskeletal: She exhibits no edema or tenderness.  Neurological: She is alert and oriented to person, place, and time. No sensory deficit.  Skin: Skin is warm and dry.  Puncture wound right proximal heel which is hemostatic and appears shallow and tracts horizontally just under skin approx 0.5 cm.  No palpable  foreign.  No swelling.       ED Treatments / Results  Labs (all labs ordered are listed, but only abnormal results are displayed) Labs Reviewed - No data to display  EKG  EKG Interpretation None       Radiology No results found.  Procedures Procedures (including critical care time)  Medications Ordered in ED Medications  Tdap (BOOSTRIX) injection 0.5 mL (0.5 mLs Intramuscular Given 05/20/16 2335)     Initial Impression / Assessment and Plan / ED Course  I have reviewed the triage vital signs and the nursing notes.  Pertinent labs & imaging results that were available during my care of the patient were reviewed by me and considered in my medical decision making (see chart for details).  Clinical Course    Pt's wound was soaked in saline and betadine solution, bulky bandage, ace wrap provided.  Tetanus updated.  Bactrim prescribed. Discussed recheck for any sign of infection, sx discussed.  Otherwise plan prn f/u.  Final Clinical Impressions(s) / ED Diagnoses   Final diagnoses:  Puncture wound of foot, right, initial encounter    New Prescriptions Discharge Medication List as of 05/20/2016 11:58 PM    START taking these medications   Details  sulfamethoxazole-trimethoprim (BACTRIM DS,SEPTRA DS) 800-160 MG tablet Take 1 tablet by mouth 2 (two) times daily., Starting Fri 05/20/2016, Until Fri 05/27/2016, Print         Burgess AmorJulie Enas Winchel, PA-C 05/21/16 0110    Donnetta HutchingBrian Cook, MD 05/23/16 [id number]

## 2016-05-21 NOTE — ED Notes (Signed)
Patient verbalizes understanding of discharge instructions, prescriptions, home care and follow up care if needed. Patient out of department at this time. 

## 2016-05-23 ENCOUNTER — Encounter (HOSPITAL_COMMUNITY): Payer: Self-pay | Admitting: *Deleted

## 2016-05-23 ENCOUNTER — Ambulatory Visit (HOSPITAL_COMMUNITY)
Admission: EM | Admit: 2016-05-23 | Discharge: 2016-05-23 | Discharge status: Against Medical Advice | Payer: Medicaid Other

## 2016-05-23 ENCOUNTER — Inpatient Hospital Stay (HOSPITAL_COMMUNITY)
Admission: AD | Admit: 2016-05-23 | Discharge: 2016-05-23 | Disposition: A | Discharge status: Home / Self Care | Payer: Medicaid Other | Source: Ambulatory Visit | Attending: Family Medicine | Admitting: Family Medicine

## 2016-05-23 DIAGNOSIS — Z880 Allergy status to penicillin: Secondary | ICD-10-CM | POA: Diagnosis not present

## 2016-05-23 DIAGNOSIS — Z202 Contact with and (suspected) exposure to infections with a predominantly sexual mode of transmission: Secondary | ICD-10-CM

## 2016-05-23 DIAGNOSIS — F17210 Nicotine dependence, cigarettes, uncomplicated: Secondary | ICD-10-CM | POA: Diagnosis not present

## 2016-05-23 DIAGNOSIS — A499 Bacterial infection, unspecified: Secondary | ICD-10-CM

## 2016-05-23 DIAGNOSIS — N921 Excessive and frequent menstruation with irregular cycle: Secondary | ICD-10-CM | POA: Diagnosis present

## 2016-05-23 DIAGNOSIS — N760 Acute vaginitis: Secondary | ICD-10-CM

## 2016-05-23 DIAGNOSIS — B9689 Other specified bacterial agents as the cause of diseases classified elsewhere: Secondary | ICD-10-CM

## 2016-05-23 LAB — URINALYSIS, ROUTINE W REFLEX MICROSCOPIC
BILIRUBIN URINE: NEGATIVE
Glucose, UA: NEGATIVE mg/dL
HGB URINE DIPSTICK: NEGATIVE
Ketones, ur: NEGATIVE mg/dL
Leukocytes, UA: NEGATIVE
Nitrite: NEGATIVE
PROTEIN: NEGATIVE mg/dL
Specific Gravity, Urine: 1.015 (ref 1.005–1.030)
pH: 7.5 (ref 5.0–8.0)

## 2016-05-23 LAB — WET PREP, GENITAL
Sperm: NONE SEEN
Trich, Wet Prep: NONE SEEN
Yeast Wet Prep HPF POC: NONE SEEN

## 2016-05-23 LAB — POCT PREGNANCY, URINE: PREG TEST UR: NEGATIVE

## 2016-05-23 MED ORDER — AZITHROMYCIN 250 MG PO TABS
1000.0000 mg | ORAL_TABLET | Freq: Once | ORAL | Status: AC
  Administered 2016-05-23: 1000 mg via ORAL
  Filled 2016-05-23: qty 4

## 2016-05-23 MED ORDER — METRONIDAZOLE 500 MG PO TABS: 500.0000 mg | ORAL_TABLET | Freq: Two times a day (BID) | ORAL | 0 refills | Status: DC

## 2016-05-23 NOTE — MAU Provider Note (Signed)
History     CSN: [id number]  Arrival date and time: 05/23/16 1620   First Provider Initiated Contact with Patient 05/23/16 2002      Chief Complaint  Patient presents with  . Metrorrhagia  . Exposure to STD   HPI Lynn Nolan is 25 y.o. [phone] weeks presented to Urgent Care today for evaluation of irregular bleeding and cramping, per her report they directed her here for gyn problem --has had 2 cycles this month and feels like she is going to start again.  Cramping is her main sxs tonight, rating pain as 4/10.  Pain is intermittent, occurring in the lower part of her abdomen. Neg for abnormal vaginal discharge.  Hx of BTL. Found out yesterday that her boyfriend tested + for Chlamydia, Negative for GC.  She reports (after checking with her Mother) she had a rash after taking Penicillin as a child.  MD told her she would probably be allergic to Mycins too so she has never taken them.    Past Medical History:  Diagnosis Date  . Anxiety   . Depression    hx of PPD  . LGA (large for gestational age) fetus    with previous pregnancy  . Previous cesarean section    x2  . Pyelonephritis   . Rh negative, maternal   . Spina bifida in child of prior pregnancy, currently pregnant     Past Surgical History:  Procedure Laterality Date  . ADENOIDECTOMY    . CESAREAN SECTION  x2  . CESAREAN SECTION WITH BILATERAL TUBAL LIGATION Bilateral 10/21/2013   Procedure: PRIMARY CESAREAN SECTION WITH BILATERAL TUBAL LIGATION;  Surgeon: Esmeralda ArthurSandra A Rivard, MD;  Location: [facility];  Service: Obstetrics;  Laterality: Bilateral;  filshie clips  . TONSILLECTOMY      History reviewed. No pertinent family history.  Social History  Substance Use Topics  . Smoking status: Current Every Day Smoker    Packs/day: 0.75    Years: 5.00    Types: Cigarettes  . Smokeless tobacco: Never Used  . Alcohol use Yes     Comment: occasionally    Allergies:  Allergies  Allergen Reactions  . Amoxicillin Other  (See Comments)    Childhood allergy  . Erythrocin Other (See Comments)    Childhood allergy  . Penicillins Other (See Comments)    Unknown Childhood reaction   . Wellbutrin [Bupropion Hcl] Nausea Only and Other (See Comments)    Dizziness    Prescriptions Prior to Admission  Medication Sig Dispense Refill Last Dose  . sulfamethoxazole-trimethoprim (BACTRIM DS,SEPTRA DS) 800-160 MG tablet Take 1 tablet by mouth 2 (two) times daily. 14 tablet 0 05/22/2016 at Unknown time  . nitrofurantoin, macrocrystal-monohydrate, (MACROBID) 100 MG capsule Take 1 capsule (100 mg total) by mouth 2 (two) times daily. (Patient not taking: Reported on 05/20/2016) 10 capsule 0 Completed Course at Unknown time    Review of Systems  Constitutional: Negative for chills and fever.  Gastrointestinal: Positive for abdominal pain (cramping in the lower abdomen). Negative for nausea and vomiting.  Genitourinary: Negative for dysuria, frequency, hematuria and urgency.       Menses X 2 this month.  Neurological: Negative for headaches.   Physical Exam   Blood pressure 116/70, pulse 108, temperature 98.1 F (36.7 C), temperature source Oral, resp. rate 16, height 5\' 2"  (1.575 m), weight 155 lb (70.3 kg), last menstrual period 05/12/2016.  Physical Exam  Nursing note and vitals reviewed. Constitutional: She is oriented to person, place, and  time. She appears well-developed and well-nourished. No distress.  HENT:  Head: Normocephalic.  Cardiovascular: Normal rate.   Respiratory: Effort normal.  GI: Soft. She exhibits no distension. There is tenderness (mild lower abdominal tenderness). There is no rebound and no guarding.  Genitourinary: There is no rash, tenderness or lesion on the right labia. There is no rash, tenderness or lesion on the left labia. Uterus is not enlarged and not tender. Cervix exhibits no motion tenderness, no discharge and no friability. Right adnexum displays no mass, no tenderness and no  fullness. Left adnexum displays no mass, no tenderness and no fullness. No erythema, tenderness or bleeding in the vagina. Vaginal discharge (small amount of white discharge without odor) found.  Neurological: She is alert and oriented to person, place, and time.  Skin: Skin is warm and dry.  Psychiatric: She has a normal mood and affect. Her behavior is normal. Thought content normal.    Results for orders placed or performed during the hospital encounter of 05/23/16 (from the past 24 hour(s))  Urinalysis, Routine w reflex microscopic (not at [facility]     Status: None   Collection Time: 05/23/16  5:17 PM  Result Value Ref Range   Color, Urine YELLOW YELLOW   APPearance CLEAR CLEAR   Specific Gravity, Urine 1.015 1.005 - 1.030   pH 7.5 5.0 - 8.0   Glucose, UA NEGATIVE NEGATIVE mg/dL   Hgb urine dipstick NEGATIVE NEGATIVE   Bilirubin Urine NEGATIVE NEGATIVE   Ketones, ur NEGATIVE NEGATIVE mg/dL   Protein, ur NEGATIVE NEGATIVE mg/dL   Nitrite NEGATIVE NEGATIVE   Leukocytes, UA NEGATIVE NEGATIVE  Pregnancy, urine POC     Status: None   Collection Time: 05/23/16  6:17 PM  Result Value Ref Range   Preg Test, Ur NEGATIVE NEGATIVE  Wet prep, genital     Status: Abnormal   Collection Time: 05/23/16  8:18 PM  Result Value Ref Range   Yeast Wet Prep HPF POC NONE SEEN NONE SEEN   Trich, Wet Prep NONE SEEN NONE SEEN   Clue Cells Wet Prep HPF POC PRESENT (A) NONE SEEN   WBC, Wet Prep HPF POC FEW (A) NONE SEEN   Sperm NONE SEEN    MAU Course  Procedures  GC/CHL culture pending  MDM MSE EXAM Labs Treated with Zithromax 1 gm po in MAU Cultures pending  SPOKE TO Sue Lush, PhD regarding the allergy to Mycins--patient reports MD told her if allergic to Penicillin (rash) would possible be allergic to Mycins).  Sue Lush did not see any reason we could not give it.  Will delay giving Rocephin  Assessment and Plan  A:  Abdominal cramping      Probably exposure to Chlamydia     Bacterial  vaginosis  P:  Zithromax 1 gm po  given in MAU      Rx to pharmacy for Flagyl      Cultures will be reported with positive      CONDOMS always   KEY,[name] 05/23/2016, 9:04 PM    Pt tolerated abx in MAU Discussed discharge plan & at home prescriptions.  Discharge info given to patient & flagyl sent to pharmacy  Judeth Horn, NP 05/23/2016 9:25 PM

## 2016-05-23 NOTE — MAU Note (Signed)
Pt states she went to UC today, was told to come to MAU.  Has had two periods in the past month, feels like she is getting ready to start again.  Found out yesterday her boyfriend tested positive for chlamydia.  Has lower abd cramping.

## 2016-05-23 NOTE — Discharge Instructions (Signed)
Bacterial Vaginosis Bacterial vaginosis is an infection of the vagina. It happens when too many germs (bacteria) grow in the vagina. Having this infection puts you at risk for getting other infections from sex. Treating this infection can help lower your risk for other infections, such as:   Chlamydia.  Gonorrhea.  HIV.  Herpes. HOME CARE  Take your medicine as told by your doctor.  Finish your medicine even if you start to feel better.  During treatment:  Avoid sex or use condoms correctly.  Do not douche.  Do not drink alcohol unless your doctor tells you it is ok.  Do not breastfeed unless your doctor tells you it is ok. GET HELP IF:  You are not getting better after 3 days of treatment.  You have more grey fluid (discharge) coming from your vagina than before.  You have more pain than before.  You have a fever. MAKE SURE YOU:   Understand these instructions.  Will watch your condition.  Will get help right away if you are not doing well or get worse.   This information is not intended to replace advice given to you by your health care provider. Make sure you discuss any questions you have with your health care provider.   Document Released: 06/07/2008 Document Revised: 09/19/2014 Document Reviewed: 04/10/2013 Elsevier Interactive Patient Education 2016 Elsevier Inc. Chlamydia, Female Chlamydia is an infection. It is spread through sexual contact. Chlamydia can be in different areas of the body. These areas include the cervix, urethra, throat, or rectum. You may not know you have chlamydia because many people never develop the symptoms. Chlamydia is not difficult to treat once you know you have it. However, if it is left untreated, chlamydia can lead to more serious health problems.  CAUSES  Chlamydia is caused by bacteria. It is a sexually transmitted disease. It is passed from an infected partner during intimate contact. This contact could be with the  genitals, mouth, or rectal area. Chlamydia can also be passed from mothers to babies during birth. SIGNS AND SYMPTOMS  There may not be any symptoms. This is often the case early in the infection. If symptoms develop, they may include:  Mild pain and discomfort when urinating.  Redness, soreness, and swelling (inflammation) of the rectum.  Vaginal discharge.  Painful intercourse.  Abdominal pain.  Bleeding between menstrual periods. DIAGNOSIS  To diagnose this infection, your health care provider will do a pelvic exam. Cultures will be taken of the vagina, cervix, urine, and possibly the rectum to verify the diagnosis.  TREATMENT You will be given antibiotic medicines. If you are pregnant, certain types of antibiotics will need to be avoided. Any sexual partners should also be treated, even if they do not show symptoms. Your health care provider may test you for infection again 3 months after treatment. HOME CARE INSTRUCTIONS   Take your antibiotic medicine as directed by your health care provider. Finish the antibiotic even if you start to feel better.  Take medicines only as directed by your health care provider.  Inform any sexual partners about the infection. They should also be treated.  Do not have sexual contact until your health care provider tells you it is okay.  Get plenty of rest.  Eat a well-balanced diet.  Drink enough fluids to keep your urine clear or pale yellow.  Keep all follow-up visits as directed by your health care provider. SEEK MEDICAL CARE IF:  You have painful urination.  You have abdominal  pain.  You have vaginal discharge.  You have painful sexual intercourse.  You have bleeding between periods and after sex.  You have a fever. SEEK IMMEDIATE MEDICAL CARE IF:   You experience nausea or vomiting.  You experience excessive sweating (diaphoresis).  You have difficulty swallowing.   This information is not intended to replace advice  given to you by your health care provider. Make sure you discuss any questions you have with your health care provider.   Document Released: 06/08/2005 Document Revised: 05/20/2015 Document Reviewed: 05/06/2013 Elsevier Interactive Patient Education [employer]2016 Elsevier Inc.

## 2016-05-24 LAB — GC/CHLAMYDIA PROBE AMP (~~LOC~~) NOT AT ARMC
CHLAMYDIA, DNA PROBE: NEGATIVE
NEISSERIA GONORRHEA: NEGATIVE

## 2016-08-01 ENCOUNTER — Emergency Department (HOSPITAL_COMMUNITY)
Admission: EM | Admit: 2016-08-01 | Discharge: 2016-08-02 | Disposition: A | Discharge status: Home / Self Care | Payer: Medicaid Other | Attending: Emergency Medicine | Admitting: Emergency Medicine

## 2016-08-01 ENCOUNTER — Encounter (HOSPITAL_COMMUNITY): Payer: Self-pay | Admitting: Emergency Medicine

## 2016-08-01 DIAGNOSIS — F17210 Nicotine dependence, cigarettes, uncomplicated: Secondary | ICD-10-CM | POA: Insufficient documentation

## 2016-08-01 DIAGNOSIS — N939 Abnormal uterine and vaginal bleeding, unspecified: Secondary | ICD-10-CM | POA: Insufficient documentation

## 2016-08-01 DIAGNOSIS — R1030 Lower abdominal pain, unspecified: Secondary | ICD-10-CM | POA: Diagnosis present

## 2016-08-01 DIAGNOSIS — E860 Dehydration: Secondary | ICD-10-CM

## 2016-08-01 DIAGNOSIS — M549 Dorsalgia, unspecified: Secondary | ICD-10-CM | POA: Insufficient documentation

## 2016-08-01 DIAGNOSIS — K529 Noninfective gastroenteritis and colitis, unspecified: Secondary | ICD-10-CM | POA: Insufficient documentation

## 2016-08-01 LAB — PREGNANCY, URINE: PREG TEST UR: NEGATIVE

## 2016-08-01 LAB — BASIC METABOLIC PANEL
Anion gap: 10 (ref 5–15)
BUN: 7 mg/dL (ref 6–20)
CALCIUM: 9.1 mg/dL (ref 8.9–10.3)
CO2: 20 mmol/L — AB (ref 22–32)
CREATININE: 0.86 mg/dL (ref 0.44–1.00)
Chloride: 106 mmol/L (ref 101–111)
GFR calc non Af Amer: >60 mL/min (ref >60)
Glucose, Bld: 109 mg/dL — ABNORMAL HIGH (ref 65–99)
Potassium: 3.3 mmol/L — ABNORMAL LOW (ref 3.5–5.1)
Sodium: 136 mmol/L (ref 135–145)

## 2016-08-01 LAB — CBC WITH DIFFERENTIAL/PLATELET
BASOS PCT: 0 %
Basophils Absolute: 0 10*3/uL (ref 0.0–0.1)
Eosinophils Absolute: 0 10*3/uL (ref 0.0–0.7)
Eosinophils Relative: 0 %
HEMATOCRIT: 44.3 % (ref 36.0–46.0)
Hemoglobin: 15.3 g/dL — ABNORMAL HIGH (ref 12.0–15.0)
LYMPHS ABS: 1.7 10*3/uL (ref 0.7–4.0)
Lymphocytes Relative: 14 %
MCH: 31.9 pg (ref 26.0–34.0)
MCHC: 34.5 g/dL (ref 30.0–36.0)
MCV: 92.5 fL (ref 78.0–100.0)
MONO ABS: 0.9 10*3/uL (ref 0.1–1.0)
MONOS PCT: 7 %
Neutro Abs: 9.6 10*3/uL — ABNORMAL HIGH (ref 1.7–7.7)
Neutrophils Relative %: 79 %
Platelets: 165 10*3/uL (ref 150–400)
RBC: 4.79 MIL/uL (ref 3.87–5.11)
RDW: 12.8 % (ref 11.5–15.5)
WBC: 12.2 10*3/uL — ABNORMAL HIGH (ref 4.0–10.5)

## 2016-08-01 LAB — URINE MICROSCOPIC-ADD ON

## 2016-08-01 LAB — URINALYSIS, ROUTINE W REFLEX MICROSCOPIC
GLUCOSE, UA: NEGATIVE mg/dL
Leukocytes, UA: NEGATIVE
Nitrite: NEGATIVE
PH: 6 (ref 5.0–8.0)
Protein, ur: 30 mg/dL — AB
Specific Gravity, Urine: >1.03 — ABNORMAL HIGH (ref 1.005–1.030)

## 2016-08-01 MED ORDER — ONDANSETRON HCL 4 MG/2ML IJ SOLN
4.0000 mg | Freq: Once | INTRAMUSCULAR | Status: AC
  Administered 2016-08-01: 4 mg via INTRAVENOUS
  Filled 2016-08-01: qty 2

## 2016-08-01 MED ORDER — SODIUM CHLORIDE 0.9 % IV SOLN
INTRAVENOUS | Status: DC
  Administered 2016-08-02: 01:00:00 via INTRAVENOUS

## 2016-08-01 MED ORDER — SODIUM CHLORIDE 0.9 % IV BOLUS (SEPSIS)
1000.0000 mL | Freq: Once | INTRAVENOUS | Status: AC
  Administered 2016-08-01: 1000 mL via INTRAVENOUS

## 2016-08-01 NOTE — ED Triage Notes (Signed)
Pt states she has been vomiting and having diarrhea since yesterday.

## 2016-08-01 NOTE — ED Notes (Signed)
Pt reports she started having abd cramping, D/V/ X1 day. States she has noticed some "brown blood" when wiping, states last menstrual cycle was abnormal for her, heavy bleeding.

## 2016-08-01 NOTE — ED Provider Notes (Addendum)
[facility]-EMER[facility]ENCY DEPT Provider Note   CSN: [id number] Arrival date & time: 08/01/16  1944  By signing my name below, I, Modena JanskyAlbert Thayil, attest that this documentation has been prepared under the direction and in the presence of Vanetta MuldersScott Romayne Ticas, MD . Electronically Signed: Modena JanskyAlbert Thayil, Scribe. 08/01/2016. 10:21 PM.  History   Chief Complaint Chief Complaint  Patient presents with  . vomiting and diarrhea   The history is provided by the patient. No language interpreter was used.  Emesis   This is a new problem. The current episode started 3 to 5 hours ago. The problem has not changed since onset.Maximum temperature: Subjective. The fever has been present for less than 1 day. Associated symptoms include abdominal pain, diarrhea, a fever and headaches. Pertinent negatives include no chills and no cough.   HPI Comments: Lynn Nolan is a 25 y.o. female who presents to the Emergency Department complaining of intermittent moderate lower abdominal pain that started yesterday. She states she started having a sudden onset of pain with associated subjective fever, vomiting, and diarrhea. Pt's temperature in the ED today was 99.2. She describes the pain as a cramping sensation. She states that her LNMP was on October 27th, but started having vaginal onset today. She denies any sick contacts, hematemesis, or hematuria.   Past Medical History:  Diagnosis Date  . Anxiety   . Depression    hx of PPD  . LGA (large for gestational age) fetus    with previous pregnancy  . Previous cesarean section    x2  . Pyelonephritis   . Rh negative, maternal   . Spina bifida in child of prior pregnancy, currently pregnant     Patient Active Problem List   Diagnosis Date Noted  . UTI (lower urinary tract infection) 04/03/2016  . Vaginal bleeding 04/03/2016  . Cesarean delivery delivered 10/21/2013  . Twin gestation, monochorionic monoamniotic 09/06/2013  . History of pyelonephritis 09/06/2013  .  History of postpartum depression 09/06/2013  . History of cesarean delivery, currently pregnant 09/06/2013  . H/o LGA (large for gestational age) fetus 09/06/2013  . Rh negative status during pregnancy 09/06/2013  . Spina bifida, child of prior pregnancy, currently pregnant 09/06/2013    Past Surgical History:  Procedure Laterality Date  . ADENOIDECTOMY    . CESAREAN SECTION  x2  . CESAREAN SECTION WITH BILATERAL TUBAL LIGATION Bilateral 10/21/2013   Procedure: PRIMARY CESAREAN SECTION WITH BILATERAL TUBAL LIGATION;  Surgeon: Esmeralda ArthurSandra A Rivard, MD;  Location: [facility];  Service: Obstetrics;  Laterality: Bilateral;  filshie clips  . TONSILLECTOMY      OB History    Gravida Para Term Preterm AB Living   3 3 2 1   4    SAB TAB Ectopic Multiple Live Births         1 1       Home Medications    Prior to Admission medications   Medication Sig Start Date End Date Taking? Authorizing Provider  metroNIDAZOLE (FLAGYL) 500 MG tablet Take 1 tablet (500 mg total) by mouth 2 (two) times daily. Patient not taking: Reported on 08/01/2016 05/23/16   Judeth HornErin Lawrence, NP    Family History No family history on file.  Social History Social History  Substance Use Topics  . Smoking status: Current Every Day Smoker    Packs/day: 0.75    Years: 5.00    Types: Cigarettes  . Smokeless tobacco: Never Used  . Alcohol use Yes     Comment: occasionally  Allergies   Amoxicillin; Erythrocin; Penicillins; and Wellbutrin [bupropion hcl]   Review of Systems Review of Systems  Constitutional: Positive for fever. Negative for chills.  HENT: Negative for congestion, rhinorrhea and sore throat.   Eyes: Negative for visual disturbance.  Respiratory: Negative for cough and shortness of breath.   Cardiovascular: Negative for chest pain and leg swelling.  Gastrointestinal: Positive for abdominal pain, diarrhea, nausea and vomiting.  Genitourinary: Positive for vaginal bleeding. Negative for dysuria and  hematuria.  Musculoskeletal: Positive for back pain.  Skin: Negative for rash.  Neurological: Positive for headaches.  Hematological: Does not bruise/bleed easily.  Psychiatric/Behavioral: Negative for confusion.     Physical Exam Updated Vital Signs BP 121/82 (BP Location: Left Arm)   Pulse 98   Temp 99.2 F (37.3 C) (Oral)   Resp 20   Ht 5\' 2"  (1.575 m)   Wt 145 lb (65.8 kg)   LMP 07/29/2016   SpO2 98%   BMI 26.52 kg/m   Physical Exam  Constitutional: She appears well-developed and well-nourished. No distress.  HENT:  Head: Normocephalic and atraumatic.  Mouth/Throat: Mucous membranes are normal.  Eyes: Conjunctivae and EOM are normal. Pupils are equal, round, and reactive to light. No scleral icterus.  Neck: Neck supple.  Cardiovascular: Normal rate and regular rhythm.   Pulmonary/Chest: Effort normal. No respiratory distress. She has no wheezes. She has no rales.  Abdominal: Soft. There is tenderness.  Decreased bowel sounds.  RLQ TTP.   Musculoskeletal: Normal range of motion. She exhibits no edema.  Neurological: She is alert.  Skin: Skin is warm and dry.  Psychiatric: She has a normal mood and affect.  Nursing note and vitals reviewed.    ED Treatments / Results  DIAGNOSTIC STUDIES: Oxygen Saturation is 98% on RA, normal by my interpretation.    COORDINATION OF CARE: 10:25 PM- Pt advised of plan for treatment and pt agrees.  Labs (all labs ordered are listed, but only abnormal results are displayed) Labs Reviewed  URINALYSIS, ROUTINE W REFLEX MICROSCOPIC (NOT AT [facility]) - Abnormal; Notable for the following:       Result Value   Specific Gravity, Urine >1.030 (*)    Hgb urine dipstick LARGE (*)    Bilirubin Urine SMALL (*)    Ketones, ur TRACE (*)    Protein, ur 30 (*)    All other components within normal limits  CBC WITH DIFFERENTIAL/PLATELET - Abnormal; Notable for the following:    WBC 12.2 (*)    Hemoglobin 15.3 (*)    Neutro Abs 9.6 (*)     All other components within normal limits  BASIC METABOLIC PANEL - Abnormal; Notable for the following:    Potassium 3.3 (*)    CO2 20 (*)    Glucose, Bld 109 (*)    All other components within normal limits  URINE MICROSCOPIC-ADD ON - Abnormal; Notable for the following:    Squamous Epithelial / LPF 0-5 (*)    Bacteria, UA FEW (*)    All other components within normal limits  PREGNANCY, URINE    EKG  EKG Interpretation None       Radiology No results found.  Procedures Procedures (including critical care time)  Medications Ordered in ED Medications  0.9 %  sodium chloride infusion (not administered)  HYDROmorphone (DILAUDID) injection 1 mg (not administered)  sodium chloride 0.9 % bolus 1,000 mL (not administered)  ondansetron (ZOFRAN) injection 4 mg (4 mg Intravenous Given 08/01/16 2312)  sodium chloride 0.9 %  bolus 1,000 mL (1,000 mLs Intravenous New Bag/Given 08/01/16 2313)     Initial Impression / Assessment and Plan / ED Course  I have reviewed the triage vital signs and the nursing notes.  Pertinent labs & imaging results that were available during my care of the patient were reviewed by me and considered in my medical decision making (see chart for details).  Clinical Course     Patient's presentation sounds suggestive of a viral gastroenteritis. The patient continues with lower quadrant abdominal pain and tenderness more so on the right. Also has a leukocytosis. Patient also clinically very dry. Patient given 1 L of normal saline will be given a second liter of normal saline. Will check CT abdomen and pelvis to rule out the acute appendicitis. Very unusual to have that with this much diarrhea. If negative patient can be discharged home with antinausea medicine and antidiarrheal medicine.  Final Clinical Impressions(s) / ED Diagnoses   Final diagnoses:  Lower abdominal pain  Gastroenteritis  Dehydration    New Prescriptions New Prescriptions   No  medications on file   I personally performed the services described in this documentation, which was scribed in my presence. The recorded information has been reviewed and is accurate.       Vanetta Mulders, MD 08/02/16 [id number]    Vanetta Mulders, MD 08/02/16 [phone]

## 2016-08-02 ENCOUNTER — Emergency Department (HOSPITAL_COMMUNITY): Payer: Medicaid Other

## 2016-08-02 MED ORDER — ONDANSETRON 4 MG PO TBDP
4.0000 mg | ORAL_TABLET | Freq: Once | ORAL | Status: AC
  Administered 2016-08-02: 4 mg via ORAL
  Filled 2016-08-02: qty 1

## 2016-08-02 MED ORDER — IOPAMIDOL (ISOVUE-300) INJECTION 61%
100.0000 mL | Freq: Once | INTRAVENOUS | Status: AC | PRN
  Administered 2016-08-02: 100 mL via INTRAVENOUS

## 2016-08-02 MED ORDER — ONDANSETRON 4 MG PO TBDP: 4.0000 mg | ORAL_TABLET | Freq: Three times a day (TID) | ORAL | 0 refills | Status: DC | PRN

## 2016-08-02 MED ORDER — SODIUM CHLORIDE 0.9 % IV BOLUS (SEPSIS)
1000.0000 mL | Freq: Once | INTRAVENOUS | Status: AC
  Administered 2016-08-02: 1000 mL via INTRAVENOUS

## 2016-08-02 MED ORDER — HYDROMORPHONE HCL 1 MG/ML IJ SOLN
1.0000 mg | Freq: Once | INTRAMUSCULAR | Status: AC
Start: 2016-08-02 — End: 2016-08-02
  Administered 2016-08-02: 1 mg via INTRAVENOUS
  Filled 2016-08-02: qty 1

## 2016-08-02 MED ORDER — OXYCODONE-ACETAMINOPHEN 5-325 MG PO TABS: 1.0000 | ORAL_TABLET | Freq: Four times a day (QID) | ORAL | 0 refills | Status: DC | PRN

## 2016-08-02 MED ORDER — OXYCODONE-ACETAMINOPHEN 5-325 MG PO TABS
1.0000 | ORAL_TABLET | Freq: Once | ORAL | Status: AC
  Administered 2016-08-02: 1 via ORAL
  Filled 2016-08-02: qty 1

## 2016-08-02 MED ORDER — IOPAMIDOL (ISOVUE-300) INJECTION 61%
INTRAVENOUS | Status: AC
  Filled 2016-08-02: qty 30

## 2016-08-02 MED ORDER — METRONIDAZOLE 500 MG PO TABS: 500.0000 mg | ORAL_TABLET | Freq: Three times a day (TID) | ORAL | 0 refills | Status: DC

## 2016-08-02 NOTE — ED Notes (Signed)
Pt states she is able to keep the water down without vomiting

## 2016-08-02 NOTE — ED Notes (Signed)
Pt given water to drink. 

## 2016-08-02 NOTE — Discharge Instructions (Signed)
You were seen today for vomiting and diarrhea. Your CT scan shows evidence of pancolitis. Sometimes this can be viral but often is related to bacterial infection. Inflammatory bowel disease is also a consideration. You will be started on Flagyl. If your symptoms do not improve, you need to follow-up with gastroenterology for further evaluation.

## 2016-08-02 NOTE — ED Provider Notes (Signed)
Patient signed out pending CT scan. CT scan concerning for pancolitis. Patient does not have a history of IBD. No recent antibiotic use. Denies recent fevers. Given the extent of colitis on CT, will treat with Flagyl. Patient will be given follow-up to gastroenterology as well symptoms do not resolve appropriately. I discussed this with the patient. She reports persistent nausea but pain is better controlled. She was able to tolerate fluids without difficulty.  After history, exam, and medical workup I feel the patient has been appropriately medically screened and is safe for discharge home. Pertinent diagnoses were discussed with the patient. Patient was given return precautions.  Ct Abdomen Pelvis W Contrast  Result Date: 08/02/2016 CLINICAL DATA:  Lower abdominal pain EXAM: CT ABDOMEN AND PELVIS WITH CONTRAST TECHNIQUE: Multidetector CT imaging of the abdomen and pelvis was performed using the standard protocol following bolus administration of intravenous contrast. CONTRAST:  100mL ISOVUE-300 IOPAMIDOL (ISOVUE-300) INJECTION 61% COMPARISON:  None. FINDINGS: Lower chest: No pulmonary nodules. No visible pleural or pericardial effusion. Hepatobiliary: Normal hepatic size and contours without focal liver lesion. No perihepatic ascites. No intra- or extrahepatic biliary dilatation. Normal gallbladder. Pancreas: Normal pancreatic contours and enhancement. No peripancreatic fluid collection or pancreatic ductal dilatation. Spleen: Normal. Adrenals/Urinary Tract: Normal adrenal glands. No hydronephrosis or solid renal mass. Stomach/Bowel: There is diffuse colonic wall thickening with hyperemia of the bowel lumen, which is fluid filled. There are clustered lymph nodes in the right lower quadrant (coronal image 27). The appendix is not visualized. Vascular/Lymphatic: Normal course and caliber of the major abdominal vessels. Clustered right lower quadrant lymph nodes measure up to 6 mm in short axis dimension.  Reproductive: The uterus and ovaries are normal. Musculoskeletal: No lytic or blastic osseous lesion. Normal visualized extrathoracic and extraperitoneal soft tissues. Other: No contributory non-categorized findings. IMPRESSION: 1. Diffuse colonic wall thickening with luminal hyperemia and mild surrounding inflammatory stranding, consistent with pancolitis. This may represent pseudomembranous colitis. Other inflammatory or infectious etiologies are also considerations. 2. Clustered right lower quadrant lymph nodes are likely reactive. Electronically Signed   By: Deatra RobinsonKevin  Herman M.D.   On: 08/02/2016 01:56      Shon Batonourtney F Horton, MD 08/02/16 [phone]

## 2016-08-03 ENCOUNTER — Encounter: Payer: Self-pay | Admitting: Gastroenterology

## 2016-08-03 ENCOUNTER — Telehealth: Payer: Self-pay | Admitting: Gastroenterology

## 2016-08-03 NOTE — Telephone Encounter (Signed)
Forwarding to Susan

## 2016-08-03 NOTE — Telephone Encounter (Signed)
OV made and letter mailed °

## 2016-08-03 NOTE — Telephone Encounter (Signed)
Pt seen in the ED and was referred to us. Can we accept her as a new patient?

## 2016-08-03 NOTE — Telephone Encounter (Signed)
Yes

## 2016-08-10 ENCOUNTER — Ambulatory Visit: Payer: Medicaid Other | Admitting: Gastroenterology

## 2016-08-26 ENCOUNTER — Ambulatory Visit: Payer: Medicaid Other | Admitting: Gastroenterology

## 2016-08-26 ENCOUNTER — Encounter: Payer: Self-pay | Admitting: Gastroenterology

## 2016-08-26 ENCOUNTER — Telehealth: Payer: Self-pay | Admitting: Gastroenterology

## 2016-08-26 NOTE — Telephone Encounter (Signed)
PT WAS A NO SHOW AND LETTER SENT  °

## 2017-10-03 ENCOUNTER — Inpatient Hospital Stay (HOSPITAL_COMMUNITY)
Admission: AD | Admit: 2017-10-03 | Discharge: 2017-10-03 | Disposition: A | Discharge status: Home / Self Care | Payer: Medicaid Other | Source: Ambulatory Visit | Attending: Obstetrics and Gynecology | Admitting: Obstetrics and Gynecology

## 2017-10-03 ENCOUNTER — Encounter (HOSPITAL_COMMUNITY): Payer: Self-pay | Admitting: *Deleted

## 2017-10-03 ENCOUNTER — Other Ambulatory Visit: Payer: Self-pay

## 2017-10-03 DIAGNOSIS — Z880 Allergy status to penicillin: Secondary | ICD-10-CM | POA: Insufficient documentation

## 2017-10-03 DIAGNOSIS — F17210 Nicotine dependence, cigarettes, uncomplicated: Secondary | ICD-10-CM | POA: Diagnosis not present

## 2017-10-03 DIAGNOSIS — F329 Major depressive disorder, single episode, unspecified: Secondary | ICD-10-CM | POA: Diagnosis not present

## 2017-10-03 DIAGNOSIS — R350 Frequency of micturition: Secondary | ICD-10-CM | POA: Diagnosis not present

## 2017-10-03 DIAGNOSIS — N898 Other specified noninflammatory disorders of vagina: Secondary | ICD-10-CM | POA: Insufficient documentation

## 2017-10-03 DIAGNOSIS — M545 Low back pain, unspecified: Secondary | ICD-10-CM

## 2017-10-03 DIAGNOSIS — N760 Acute vaginitis: Secondary | ICD-10-CM | POA: Diagnosis not present

## 2017-10-03 DIAGNOSIS — B9689 Other specified bacterial agents as the cause of diseases classified elsewhere: Secondary | ICD-10-CM | POA: Insufficient documentation

## 2017-10-03 DIAGNOSIS — R110 Nausea: Secondary | ICD-10-CM | POA: Insufficient documentation

## 2017-10-03 DIAGNOSIS — F419 Anxiety disorder, unspecified: Secondary | ICD-10-CM | POA: Diagnosis not present

## 2017-10-03 LAB — CBC
HCT: 40.3 % (ref 36.0–46.0)
Hemoglobin: 13.8 g/dL (ref 12.0–15.0)
MCH: 31.5 pg (ref 26.0–34.0)
MCHC: 34.2 g/dL (ref 30.0–36.0)
MCV: 92 fL (ref 78.0–100.0)
Platelets: 189 10*3/uL (ref 150–400)
RBC: 4.38 MIL/uL (ref 3.87–5.11)
RDW: 12.5 % (ref 11.5–15.5)
WBC: 10.4 10*3/uL (ref 4.0–10.5)

## 2017-10-03 LAB — URINALYSIS, ROUTINE W REFLEX MICROSCOPIC
Bilirubin Urine: NEGATIVE
Glucose, UA: NEGATIVE mg/dL
Hgb urine dipstick: NEGATIVE
Ketones, ur: NEGATIVE mg/dL
Leukocytes, UA: NEGATIVE
NITRITE: NEGATIVE
Protein, ur: NEGATIVE mg/dL
SPECIFIC GRAVITY, URINE: 1.024 (ref 1.005–1.030)
pH: 7 (ref 5.0–8.0)

## 2017-10-03 LAB — WET PREP, GENITAL
Sperm: NONE SEEN
Trich, Wet Prep: NONE SEEN
YEAST WET PREP: NONE SEEN

## 2017-10-03 LAB — POCT PREGNANCY, URINE: PREG TEST UR: NEGATIVE

## 2017-10-03 MED ORDER — ONDANSETRON HCL 4 MG PO TABS: 4.0000 mg | ORAL_TABLET | Freq: Three times a day (TID) | ORAL | 1 refills | Status: DC | PRN

## 2017-10-03 MED ORDER — METRONIDAZOLE 0.75 % VA GEL: 1.0000 | Freq: Every day | VAGINAL | 1 refills | Status: DC

## 2017-10-03 NOTE — MAU Note (Signed)
Been having cramps all the time for about a month.  Has been having ongoing nausea.  Frequency with urination, no pain with it; if she tries to hold it - it makes her nauseated.  Also has noted an increase in d/c, slight odor. Period is late, has had tubes tied

## 2017-10-03 NOTE — Discharge Instructions (Signed)
[facility]  ° ° °[facility]       Phone: [phone] ° °[facility] Phone: [phone] ° °[facility] [place]  Phone: [phone] ° °[facility] [place]  Phone: [phone] ° °[facility] [place]  Phone: [phone] ° °[facility]       Phone: [phone] ° °[facility]    Phone: [phone]  ° °[facility]    Phone: [phone] ° °[facility]    Phone: [phone] ° °[facility]    Phone: [phone] ° °[facility]    Phone: [phone] ° °[facility]       Phone: [phone]  ° °[facility]  Phone: [phone] ° °[facility]    Phone: [phone] ° °[facility]   Phone: [phone] ° °[facility]      Phone: [phone] ° °[facility]    Phone: [phone] ° °

## 2017-10-03 NOTE — MAU Note (Signed)
Pt presents with c/o lower abdominal and back pain, mostly in back.  Reports urinary frequency & urgency, denies dysuria or burning.  States if she holds urine, gets nauseated.   Reports LMP 08/18/17, s/p BTL.  Hasn't taken UPT.  Denies VB, reports vaginal discharge that has foul odor.

## 2017-10-03 NOTE — MAU Provider Note (Signed)
Chief Complaint: Nausea; Vaginal Discharge; Back Pain; and Urinary Frequency   First Provider Initiated Contact with Patient 10/03/17 1509      SUBJECTIVE HPI: Lynn Nolan is a 27 y.o. [phone] who presents to maternity admissions reporting nausea and intermittent abdominal cramping x 4 years, since her BTL but with worsening nausea, back pain, vaginal discharge, and urinary frequency x 3-4 days.  She reports her daughter had vomiting and diarrhea 3-4 days ago but the pt denies vomiting or diarrhea.  And, she reports her nausea has been intermittent x 4 years, worse in the last month, and much worse in the last 3-4 days with new onset back pain, vaginal discharge, and urinary frequency.  She has not taken a pregnancy test.  She reports IBS runs in her family. She has not tried any treatments. There are no other associated symptoms.   HPI  Past Medical History:  Diagnosis Date  . Anxiety   . Depression    hx of PPD  . LGA (large for gestational age) fetus    with previous pregnancy  . Previous cesarean section    x2  . Pyelonephritis   . Rh negative, maternal   . Spina bifida in child of prior pregnancy, currently pregnant    Past Surgical History:  Procedure Laterality Date  . ADENOIDECTOMY    . CESAREAN SECTION  x2  . CESAREAN SECTION WITH BILATERAL TUBAL LIGATION Bilateral 10/21/2013   Procedure: PRIMARY CESAREAN SECTION WITH BILATERAL TUBAL LIGATION;  Surgeon: Esmeralda Arthur, MD;  Location: [facility];  Service: Obstetrics;  Laterality: Bilateral;  filshie clips  . TONSILLECTOMY     Social History   Socioeconomic History  . Marital status: Single    Spouse name: Not on file  . Number of children: Not on file  . Years of education: Not on file  . Highest education level: Not on file  Social Needs  . Financial resource strain: Not on file  . Food insecurity - worry: Not on file  . Food insecurity - inability: Not on file  . Transportation needs - medical: Not on file   . Transportation needs - non-medical: Not on file  Occupational History  . Not on file  Tobacco Use  . Smoking status: Current Every Day Smoker    Packs/day: 0.75    Years: 5.00    Pack years: 3.75    Types: Cigarettes  . Smokeless tobacco: Never Used  Substance and Sexual Activity  . Alcohol use: Yes    Comment: occasionally  . Drug use: No  . Sexual activity: Yes    Birth control/protection: Surgical  Other Topics Concern  . Not on file  Social History Narrative  . Not on file   No current facility-administered medications on file prior to encounter.    No current outpatient medications on file prior to encounter.   Allergies  Allergen Reactions  . Amoxicillin Other (See Comments)    Childhood allergy  . Erythromycin     Childhood allergy  . Penicillins Other (See Comments)    Has patient had a PCN reaction causing immediate rash, facial/tongue/throat swelling, SOB or lightheadedness with hypotension: unknown Has patient had a PCN reaction causing severe rash involving mucus membranes or skin necrosis:unknown} Has patient had a PCN reaction that required hospitalization:  unknown Has patient had a PCN reaction occurring within the last 10 years: unknown  . Wellbutrin [Bupropion Hcl] Nausea Only and Other (See Comments)    Dizziness  ROS:  Review of Systems  Constitutional: Negative for chills, fatigue and fever.  Respiratory: Negative for shortness of breath.   Cardiovascular: Negative for chest pain.  Gastrointestinal: Positive for nausea. Negative for constipation, diarrhea and vomiting.  Genitourinary: Positive for pelvic pain. Negative for difficulty urinating, dysuria, flank pain, vaginal bleeding, vaginal discharge and vaginal pain.  Neurological: Negative for dizziness and headaches.  Psychiatric/Behavioral: Negative.      I have reviewed patient's Past Medical Hx, Surgical Hx, Family Hx, Social Hx, medications and allergies.   Physical Exam    Patient Vitals for the past 24 hrs:  BP Temp Temp src Pulse Resp SpO2 Weight  10/03/17 1343 123/68 99.2 F (37.3 C) Oral 89 17 99 % 157 lb (71.2 kg)   Constitutional: Well-developed, well-nourished female in no acute distress.  Cardiovascular: normal rate Respiratory: normal effort GI: Abd soft, non-tender. Pos BS x 4 MS: Extremities nontender, no edema, normal ROM Neurologic: Alert and oriented x 4.  GU: Neg CVAT.  PELVIC EXAM: Cervix pink, visually closed, without lesion, small amount thin white malodorous discharge, vaginal walls and external genitalia normal Bimanual exam: Cervix 0/long/high, firm, anterior, neg CMT, uterus nontender, nonenlarged, adnexa without tenderness, enlargement, or mass   LAB RESULTS Results for orders placed or performed during the hospital encounter of 10/03/17 (from the past 24 hour(s))  Urinalysis, Routine w reflex microscopic     Status: None   Collection Time: 10/03/17  1:46 PM  Result Value Ref Range   Color, Urine YELLOW YELLOW   APPearance CLEAR CLEAR   Specific Gravity, Urine 1.024 1.005 - 1.030   pH 7.0 5.0 - 8.0   Glucose, UA NEGATIVE NEGATIVE mg/dL   Hgb urine dipstick NEGATIVE NEGATIVE   Bilirubin Urine NEGATIVE NEGATIVE   Ketones, ur NEGATIVE NEGATIVE mg/dL   Protein, ur NEGATIVE NEGATIVE mg/dL   Nitrite NEGATIVE NEGATIVE   Leukocytes, UA NEGATIVE NEGATIVE  Pregnancy, urine POC     Status: None   Collection Time: 10/03/17  1:54 PM  Result Value Ref Range   Preg Test, Ur NEGATIVE NEGATIVE  CBC     Status: None   Collection Time: 10/03/17  2:59 PM  Result Value Ref Range   WBC 10.4 4.0 - 10.5 K/uL   RBC 4.38 3.87 - 5.11 MIL/uL   Hemoglobin 13.8 12.0 - 15.0 g/dL   HCT 16.140.3 09.636.0 - 04.546.0 %   MCV 92.0 78.0 - 100.0 fL   MCH 31.5 26.0 - 34.0 pg   MCHC 34.2 30.0 - 36.0 g/dL   RDW 40.912.5 81.111.5 - 91.415.5 %   Platelets 189 150 - 400 K/uL  Wet prep, genital     Status: Abnormal   Collection Time: 10/03/17  3:10 PM  Result Value Ref Range    Yeast Wet Prep HPF POC NONE SEEN NONE SEEN   Trich, Wet Prep NONE SEEN NONE SEEN   Clue Cells Wet Prep HPF POC PRESENT (A) NONE SEEN   WBC, Wet Prep HPF POC MANY (A) NONE SEEN   Sperm NONE SEEN        IMAGING No results found.  MAU Management/MDM: Ordered labs and reviewed results.  Pregnancy test negative.  No evidence of anemia, WBCs wnl.  Wet prep with clue cells, plus pt malodorous discharge so will treat for BV with Metrogel to avoid worsening pt nausea.  Zofran 4 mg PO Q 8 hours PRN for nausea.  Pt to f/u with PCP to investigate GI symptoms.  UA wnl but will  send for culture r/t pt frequency.  Encouraged pt to increase PO fluids, avoid caffeine and alcohol temporarily.  F/U with PCP, with [facility] for gyn care, return to MAU as needed for emergencies.    ASSESSMENT 1. Bacterial vaginosis   2. Nausea without vomiting   3. Acute bilateral low back pain without sciatica   4. Urinary frequency     PLAN Discharge home Allergies as of 10/03/2017      Reactions   Amoxicillin Other (See Comments)   Childhood allergy   Erythromycin    Childhood allergy   Penicillins Other (See Comments)   Has patient had a PCN reaction causing immediate rash, facial/tongue/throat swelling, SOB or lightheadedness with hypotension: unknown Has patient had a PCN reaction causing severe rash involving mucus membranes or skin necrosis:unknown} Has patient had a PCN reaction that required hospitalization:  unknown Has patient had a PCN reaction occurring within the last 10 years: unknown   Wellbutrin [bupropion Hcl] Nausea Only, Other (See Comments)   Dizziness      Medication List    TAKE these medications   metroNIDAZOLE 0.75 % vaginal gel Commonly known as:  METROGEL Place 1 Applicatorful vaginally at bedtime. Apply one applicatorful to vagina at bedtime for 5 days   ondansetron 4 MG tablet Commonly known as:  ZOFRAN Take 1 tablet (4 mg total) by mouth every 8 (eight) hours as needed for  nausea or vomiting.      Follow-up Information    With primary care and Gyn as desired Follow up.   Why:  F/U with your primary care provider and gyn of your choice, see list provided          Sharen Counter Certified Nurse-Midwife 10/03/2017  4:04 PM

## 2017-10-04 LAB — URINE CULTURE

## 2017-10-04 LAB — HIV ANTIBODY (ROUTINE TESTING W REFLEX): HIV Screen 4th Generation wRfx: NONREACTIVE

## 2017-10-04 LAB — RPR: RPR Ser Ql: NONREACTIVE

## 2017-10-05 LAB — GC/CHLAMYDIA PROBE AMP (~~LOC~~) NOT AT ARMC
Chlamydia: NEGATIVE
NEISSERIA GONORRHEA: NEGATIVE

## 2017-10-07 ENCOUNTER — Encounter (HOSPITAL_COMMUNITY): Payer: Self-pay | Admitting: Emergency Medicine

## 2017-10-07 ENCOUNTER — Emergency Department (HOSPITAL_COMMUNITY): Payer: Medicaid Other

## 2017-10-07 ENCOUNTER — Emergency Department (HOSPITAL_COMMUNITY)
Admission: EM | Admit: 2017-10-07 | Discharge: 2017-10-07 | Disposition: A | Discharge status: Home / Self Care | Payer: Medicaid Other | Attending: Emergency Medicine | Admitting: Emergency Medicine

## 2017-10-07 DIAGNOSIS — R110 Nausea: Secondary | ICD-10-CM | POA: Diagnosis not present

## 2017-10-07 DIAGNOSIS — R109 Unspecified abdominal pain: Secondary | ICD-10-CM | POA: Diagnosis present

## 2017-10-07 DIAGNOSIS — R197 Diarrhea, unspecified: Secondary | ICD-10-CM | POA: Diagnosis not present

## 2017-10-07 DIAGNOSIS — F17210 Nicotine dependence, cigarettes, uncomplicated: Secondary | ICD-10-CM | POA: Diagnosis not present

## 2017-10-07 DIAGNOSIS — E860 Dehydration: Secondary | ICD-10-CM

## 2017-10-07 HISTORY — DX: Nausea: R11.0

## 2017-10-07 HISTORY — DX: Unspecified abdominal pain: R10.9

## 2017-10-07 LAB — CBC WITH DIFFERENTIAL/PLATELET
BASOS ABS: 0 10*3/uL (ref 0.0–0.1)
Basophils Relative: 0 %
Eosinophils Absolute: 0.4 10*3/uL (ref 0.0–0.7)
Eosinophils Relative: 3 %
HEMATOCRIT: 46.9 % — AB (ref 36.0–46.0)
HEMOGLOBIN: 15.7 g/dL — AB (ref 12.0–15.0)
LYMPHS PCT: 25 %
Lymphs Abs: 3.2 10*3/uL (ref 0.7–4.0)
MCH: 31.4 pg (ref 26.0–34.0)
MCHC: 33.5 g/dL (ref 30.0–36.0)
MCV: 93.8 fL (ref 78.0–100.0)
Monocytes Absolute: 0.6 10*3/uL (ref 0.1–1.0)
Monocytes Relative: 5 %
NEUTROS ABS: 8.5 10*3/uL — AB (ref 1.7–7.7)
NEUTROS PCT: 67 %
Platelets: 211 10*3/uL (ref 150–400)
RBC: 5 MIL/uL (ref 3.87–5.11)
RDW: 12.6 % (ref 11.5–15.5)
WBC: 12.7 10*3/uL — AB (ref 4.0–10.5)

## 2017-10-07 LAB — URINALYSIS, ROUTINE W REFLEX MICROSCOPIC
BILIRUBIN URINE: NEGATIVE
GLUCOSE, UA: NEGATIVE mg/dL
Hgb urine dipstick: NEGATIVE
KETONES UR: NEGATIVE mg/dL
LEUKOCYTES UA: NEGATIVE
NITRITE: NEGATIVE
PROTEIN: NEGATIVE mg/dL
Specific Gravity, Urine: 1.009 (ref 1.005–1.030)
pH: 6 (ref 5.0–8.0)

## 2017-10-07 LAB — COMPREHENSIVE METABOLIC PANEL
ALT: 18 U/L (ref 14–54)
ANION GAP: 12 (ref 5–15)
AST: 18 U/L (ref 15–41)
Albumin: 4.6 g/dL (ref 3.5–5.0)
Alkaline Phosphatase: 72 U/L (ref 38–126)
BILIRUBIN TOTAL: 0.7 mg/dL (ref 0.3–1.2)
BUN: 10 mg/dL (ref 6–20)
CHLORIDE: 105 mmol/L (ref 101–111)
CO2: 23 mmol/L (ref 22–32)
Calcium: 9.8 mg/dL (ref 8.9–10.3)
Creatinine, Ser: 0.75 mg/dL (ref 0.44–1.00)
GFR calc Af Amer: >60 mL/min (ref >60)
Glucose, Bld: 100 mg/dL — ABNORMAL HIGH (ref 65–99)
POTASSIUM: 4.4 mmol/L (ref 3.5–5.1)
Sodium: 140 mmol/L (ref 135–145)
TOTAL PROTEIN: 8 g/dL (ref 6.5–8.1)

## 2017-10-07 LAB — LIPASE, BLOOD: LIPASE: 24 U/L (ref 11–51)

## 2017-10-07 LAB — PREGNANCY, URINE: PREG TEST UR: NEGATIVE

## 2017-10-07 MED ORDER — SODIUM CHLORIDE 0.9 % IV BOLUS (SEPSIS)
1000.0000 mL | Freq: Once | INTRAVENOUS | Status: AC
  Administered 2017-10-07: 1000 mL via INTRAVENOUS

## 2017-10-07 MED ORDER — SODIUM CHLORIDE 0.9 % IV BOLUS (SEPSIS)
1000.0000 mL | Freq: Once | INTRAVENOUS | Status: AC
Start: 2017-10-07 — End: 2017-10-07
  Administered 2017-10-07: 1000 mL via INTRAVENOUS

## 2017-10-07 MED ORDER — ONDANSETRON 4 MG PO TBDP: 4.0000 mg | ORAL_TABLET | Freq: Three times a day (TID) | ORAL | 0 refills | Status: DC | PRN

## 2017-10-07 MED ORDER — ONDANSETRON HCL 4 MG/2ML IJ SOLN
4.0000 mg | INTRAMUSCULAR | Status: DC | PRN
Start: 2017-10-07 — End: 2017-10-07
  Administered 2017-10-07: 4 mg via INTRAVENOUS
  Filled 2017-10-07: qty 2

## 2017-10-07 NOTE — ED Provider Notes (Signed)
[facility] EMERGENCY DEPARTMENT Provider Note   CSN: [id number] Arrival date & time: 10/07/17  1251     History   Chief Complaint Chief Complaint  Patient presents with  . Medication Reaction    HPI Lynn Nolan is a 27 y.o. female.  HPI  Pt was seen at 1315.  Per pt, c/o gradual onset and persistence of constant multiple symptoms for the past 2 days. Pt's symptoms include: generalized abd "cramping," nausea, multiple intermittent episodes of diarrhea, "yellow tongue," "tingling" of her hands and feet. Pt states she was evaluated at [facility]U for vaginal discharge on 10/03/2017 and was rx flagyl vaginal gel. Pt states she took one dose on 10/05/2017 before her above symptoms began. Pt states she called the pharmacy and was told to come to the ED for evaluation "for side effects of the medication." Denies vaginal discharge/pain/bleeding, no dysuria/hematuria, no flank pain, no CP/SOB, no rash, no fevers, no vomiting, no black or blood in stools. Pt endorses hx of chronic nausea, chronic intermittent abd cramping. Pt also has had exposure to her child who had N/V/D illness last week. Pt continues to smoke cigarettes.   Past Medical History:  Diagnosis Date  . Abdominal cramping    intermittent "since BLT"  . Anxiety   . Chronic nausea   . Depression    hx of PPD  . LGA (large for gestational age) fetus    with previous pregnancy  . Previous cesarean section    x2  . Pyelonephritis   . Rh negative, maternal   . Spina bifida in child of prior pregnancy, currently pregnant     Patient Active Problem List   Diagnosis Date Noted  . Chronic nausea 10/03/2017  . History of pyelonephritis 09/06/2013  . History of postpartum depression 09/06/2013    Past Surgical History:  Procedure Laterality Date  . ADENOIDECTOMY    . CESAREAN SECTION  x2  . CESAREAN SECTION WITH BILATERAL TUBAL LIGATION Bilateral 10/21/2013   Procedure: PRIMARY CESAREAN SECTION WITH BILATERAL TUBAL  LIGATION;  Surgeon: Esmeralda Arthur, MD;  Location: [facility];  Service: Obstetrics;  Laterality: Bilateral;  filshie clips  . TONSILLECTOMY      OB History    Gravida Para Term Preterm AB Living   3 3 2 1   4    SAB TAB Ectopic Multiple Live Births         1 1       Home Medications    Prior to Admission medications   Medication Sig Start Date End Date Taking? Authorizing Provider  ibuprofen (ADVIL,MOTRIN) 200 MG tablet Take 600 mg by mouth every 6 (six) hours as needed for moderate pain.   Yes [provider]  metroNIDAZOLE (METROGEL) 0.75 % vaginal gel Place 1 Applicatorful vaginally at bedtime. Apply one applicatorful to vagina at bedtime for 5 days 10/03/17  Yes Leftwich-Kirby, Wilmer Floor, CNM  ondansetron (ZOFRAN) 4 MG tablet Take 1 tablet (4 mg total) by mouth every 8 (eight) hours as needed for nausea or vomiting. 10/03/17  Yes Leftwich-Kirby, Wilmer Floor, CNM    Family History Family History  Problem Relation Age of Onset  . Diabetes Maternal Grandfather     Social History Social History   Tobacco Use  . Smoking status: Current Every Day Smoker    Packs/day: 0.75    Years: 5.00    Pack years: 3.75    Types: Cigarettes  . Smokeless tobacco: Never Used  Substance Use Topics  .  Alcohol use: Yes    Comment: occasionally  . Drug use: No     Allergies   Amoxicillin; Erythromycin; Penicillins; and Wellbutrin [bupropion hcl]   Review of Systems Review of Systems ROS: Statement: All systems negative except as marked or noted in the HPI; Constitutional: Negative for fever and chills. ; ; Eyes: Negative for eye pain, redness and discharge. ; ; ENMT: Negative for ear pain, hoarseness, nasal congestion, sinus pressure and sore throat. ; ; Cardiovascular: Negative for chest pain, palpitations, diaphoresis, dyspnea and peripheral edema. ; ; Respiratory: Negative for cough, wheezing and stridor. ; ; Gastrointestinal: +nausea, diarrhea, abd pain. Negative for vomiting, blood in  stool, hematemesis, jaundice and rectal bleeding. . ; ; Genitourinary: Negative for dysuria, flank pain and hematuria. ; ; Musculoskeletal: Negative for back pain and neck pain. Negative for swelling and trauma.; ; Skin: Negative for pruritus, rash, abrasions, blisters, bruising and skin lesion.; ; Neuro: +paresthesias bilat hands, feet. Negative for headache, lightheadedness and neck stiffness. Negative for weakness, altered level of consciousness, altered mental status, extremity weakness, involuntary movement, seizure and syncope.       Physical Exam Updated Vital Signs BP 127/80   Pulse (!) 112   Temp (!) 97.5 F (36.4 C) (Oral)   Resp 18   Ht 5\' 2"  (1.575 m)   Wt 71.2 kg (157 lb)   LMP 08/18/2017   SpO2 99%   BMI 28.72 kg/m   BP (!) 104/52   Pulse 79   Temp 98.1 F (36.7 C) (Oral)   Resp 18   Ht 5\' 2"  (1.575 m)   Wt 71.2 kg (157 lb)   LMP 08/18/2017 Comment: neg u preg  SpO2 98%   BMI 28.72 kg/m    Physical Exam 1320: Physical examination:  Nursing notes reviewed; Vital signs and O2 SAT reviewed;  Constitutional: Well developed, Well nourished, In no acute distress; Head:  Normocephalic, atraumatic; Eyes: EOMI, PERRL, No scleral icterus; ENMT: Mouth and pharynx normal, Mucous membranes dry and cracked. Tongue has yellow-brown coating.; Neck: Supple, Full range of motion, No lymphadenopathy; Cardiovascular: Regular rate and rhythm, No gallop; Respiratory: Breath sounds clear & equal bilaterally, No wheezes.  Speaking full sentences with ease, Normal respiratory effort/excursion; Chest: Nontender, Movement normal; Abdomen: Soft, Nontender, Nondistended, Normal bowel sounds; Genitourinary: No CVA tenderness; Extremities: Pulses normal, No tenderness, No edema, No calf edema or asymmetry.; Neuro: AA&Ox3, Major CN grossly intact. No facial droop. Speech clear. No gross focal motor or sensory deficits in extremities. Climbs on and off stretcher easily by herself. Gait steady..; Skin:  Color normal, Warm, Dry.; Psych:  Anxious.    ED Treatments / Results  Labs (all labs ordered are listed, but only abnormal results are displayed)   EKG  EKG Interpretation None       Radiology   Procedures Procedures (including critical care time)  Medications Ordered in ED Medications  ondansetron (ZOFRAN) injection 4 mg (4 mg Intravenous Given 10/07/17 1355)  sodium chloride 0.9 % bolus 1,000 mL (not administered)  sodium chloride 0.9 % bolus 1,000 mL (1,000 mLs Intravenous New Bag/Given 10/07/17 1355)     Initial Impression / Assessment and Plan / ED Course  I have reviewed the triage vital signs and the nursing notes.  Pertinent labs & imaging results that were available during my care of the patient were reviewed by me and considered in my medical decision making (see chart for details).  MDM Reviewed: previous chart, nursing note and vitals Reviewed previous: labs  Interpretation: labs and CT scan   Results for orders placed or performed during the hospital encounter of 10/07/17  Pregnancy, urine  Result Value Ref Range   Preg Test, Ur NEGATIVE NEGATIVE  Urinalysis, Routine w reflex microscopic  Result Value Ref Range   Color, Urine YELLOW YELLOW   APPearance CLEAR CLEAR   Specific Gravity, Urine 1.009 1.005 - 1.030   pH 6.0 5.0 - 8.0   Glucose, UA NEGATIVE NEGATIVE mg/dL   Hgb urine dipstick NEGATIVE NEGATIVE   Bilirubin Urine NEGATIVE NEGATIVE   Ketones, ur NEGATIVE NEGATIVE mg/dL   Protein, ur NEGATIVE NEGATIVE mg/dL   Nitrite NEGATIVE NEGATIVE   Leukocytes, UA NEGATIVE NEGATIVE  Comprehensive metabolic panel  Result Value Ref Range   Sodium 140 135 - 145 mmol/L   Potassium 4.4 3.5 - 5.1 mmol/L   Chloride 105 101 - 111 mmol/L   CO2 23 22 - 32 mmol/L   Glucose, Bld 100 (H) 65 - 99 mg/dL   BUN 10 6 - 20 mg/dL   Creatinine, Ser 1.610.75 0.44 - 1.00 mg/dL   Calcium 9.8 8.9 - 09.610.3 mg/dL   Total Protein 8.0 6.5 - 8.1 g/dL   Albumin 4.6 3.5 - 5.0 g/dL     AST 18 15 - 41 U/L   ALT 18 14 - 54 U/L   Alkaline Phosphatase 72 38 - 126 U/L   Total Bilirubin 0.7 0.3 - 1.2 mg/dL   GFR calc non Af Amer >60 >60 mL/min   GFR calc Af Amer >60 >60 mL/min   Anion gap 12 5 - 15  Lipase, blood  Result Value Ref Range   Lipase 24 11 - 51 U/L  CBC with Differential  Result Value Ref Range   WBC 12.7 (H) 4.0 - 10.5 K/uL   RBC 5.00 3.87 - 5.11 MIL/uL   Hemoglobin 15.7 (H) 12.0 - 15.0 g/dL   HCT 04.546.9 (H) 40.936.0 - 81.146.0 %   MCV 93.8 78.0 - 100.0 fL   MCH 31.4 26.0 - 34.0 pg   MCHC 33.5 30.0 - 36.0 g/dL   RDW 91.412.6 78.211.5 - 95.615.5 %   Platelets 211 150 - 400 K/uL   Neutrophils Relative % 67 %   Neutro Abs 8.5 (H) 1.7 - 7.7 K/uL   Lymphocytes Relative 25 %   Lymphs Abs 3.2 0.7 - 4.0 K/uL   Monocytes Relative 5 %   Monocytes Absolute 0.6 0.1 - 1.0 K/uL   Eosinophils Relative 3 %   Eosinophils Absolute 0.4 0.0 - 0.7 K/uL   Basophils Relative 0 %   Basophils Absolute 0.0 0.0 - 0.1 K/uL   Ct Renal Stone Study Result Date: 10/07/2017 CLINICAL DATA:  Diarrhea with back pain. EXAM: CT ABDOMEN AND PELVIS WITHOUT CONTRAST TECHNIQUE: Multidetector CT imaging of the abdomen and pelvis was performed following the standard protocol without IV contrast. COMPARISON:  08/02/2016 FINDINGS: Lower chest:  No contributory findings. Hepatobiliary: No focal liver abnormality.No evidence of biliary obstruction or stone. Pancreas: Unremarkable. Spleen: Unremarkable. Adrenals/Urinary Tract: Negative adrenals. No hydronephrosis or stone. Unremarkable bladder. Stomach/Bowel:  No obstruction. No appendicitis. Vascular/Lymphatic: No acute vascular abnormality. No mass or adenopathy. Reproductive:Right ovarian/adnexal cysts measuring up to 21 mm. Tubal ligation clips. Other: No ascites or pneumoperitoneum. Musculoskeletal: No acute abnormalities. IMPRESSION: No acute finding or explanation for symptoms. Electronically Signed   By: Marnee SpringJonathon  Watts M.D.   On: 10/07/2017 15:20     1325:  Pt  appears anxious. Doubt s/e flagyl. Mucus membranes are dry  and cracked, tongue is yellow-brown c/w cigarette smoking. [facility]:  Pt's symptoms are not side effects of intra-vaginal flagyl.    1425:  VS per baseline. IVF given for clinical dehydration. Pt calmer. Pt has tol PO well while in the ED without N/V.  No stooling while in the ED.  Abd remains benign, VSS. Feels better and wants to go home now.  Tx symptomatically at this time. Dx and testing d/w pt.  Questions answered.  Verb understanding, agreeable to d/c home with outpt f/u.   Final Clinical Impressions(s) / ED Diagnoses   Final diagnoses:  None    ED Discharge Orders    None       Samuel Jester, DO 10/08/17 1539

## 2017-10-07 NOTE — ED Triage Notes (Signed)
Pt reports [facility].  Used one dose on 10/05/17 and began having abd cramping with generalized aches, tongue has yellow coating, tingling all over, and nauseated.

## 2017-10-07 NOTE — Discharge Instructions (Signed)
Take the prescription as directed.  Increase your fluid intake (ie:  Gatoraide) for the next few days.  Eat a bland diet and advance to your regular diet slowly as you can tolerate it.   Avoid full strength juices, as well as milk and milk products until your diarrhea has resolved.   Call your regular medical doctor Monday to schedule a follow up appointment this week.  Return to the Emergency Department immediately if you develop any black or bloody stool or vomit, if you develop a fever over "101," or for any other concerns.

## 2018-11-03 ENCOUNTER — Encounter (HOSPITAL_COMMUNITY): Payer: Self-pay | Admitting: Emergency Medicine

## 2018-11-03 ENCOUNTER — Ambulatory Visit (HOSPITAL_COMMUNITY)
Admission: EM | Admit: 2018-11-03 | Discharge: 2018-11-03 | Disposition: A | Discharge status: Home / Self Care | Payer: Medicaid Other | Attending: Family Medicine | Admitting: Family Medicine

## 2018-11-03 DIAGNOSIS — R6889 Other general symptoms and signs: Secondary | ICD-10-CM | POA: Diagnosis not present

## 2018-11-03 DIAGNOSIS — J0100 Acute maxillary sinusitis, unspecified: Secondary | ICD-10-CM | POA: Diagnosis not present

## 2018-11-03 MED ORDER — BENZONATATE 100 MG PO CAPS: 100.0000 mg | ORAL_CAPSULE | Freq: Three times a day (TID) | ORAL | 0 refills | Status: DC | PRN

## 2018-11-03 MED ORDER — CLINDAMYCIN HCL 150 MG PO CAPS: 150.0000 mg | ORAL_CAPSULE | Freq: Three times a day (TID) | ORAL | 0 refills | Status: DC

## 2018-11-03 NOTE — Discharge Instructions (Signed)
Afrin nasal spray (oxymetazoline) twice daily for 2 days.

## 2018-11-03 NOTE — ED Triage Notes (Signed)
Pt sts URI sx with congestion

## 2018-11-03 NOTE — ED Provider Notes (Signed)
[facility]    CSN: [id number] Arrival date & time: 11/03/18  1630     History   Chief Complaint Chief Complaint  Patient presents with  . URI    HPI Lynn Nolan is a 28 y.o. female.   This is a 28 year old woman who is an established patient here at [facility] urgent care.  She presents today with several days of upper respiratory symptoms including congestion.  Symptoms began abruptly with fever and myalgia 3 days ago.  Now her sinuses are quite full, sore, and causing a frontal headache.    Patient cares for 4 children as single parent.  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     Past Medical History:  Diagnosis Date  . Abdominal cramping    intermittent "since BLT"  . Anxiety   . Chronic nausea   . Depression    hx of PPD  . LGA (large for gestational age) fetus    with previous pregnancy  . Previous cesarean section    x2  . Pyelonephritis   . Rh negative, maternal   . Spina bifida in child of prior pregnancy, currently pregnant     Patient Active Problem List   Diagnosis Date Noted  . Chronic nausea 10/03/2017  . History of pyelonephritis 09/06/2013  . History of postpartum depression 09/06/2013    Past Surgical History:  Procedure Laterality Date  . ADENOIDECTOMY    . CESAREAN SECTION  x2  . CESAREAN SECTION WITH BILATERAL TUBAL LIGATION Bilateral 10/21/2013   Procedure: PRIMARY CESAREAN SECTION WITH BILATERAL TUBAL LIGATION;  Surgeon: Esmeralda Arthur, MD;  Location: [facility];  Service: Obstetrics;  Laterality: Bilateral;  filshie clips  . TONSILLECTOMY      OB History    Gravida  3   Para  3   Term  2   Preterm  1   AB      Living  4     SAB      TAB      Ectopic      Multiple  1   Live Births  1            Home Medications    Prior to Admission medications   Medication Sig Start Date End Date Taking? Authorizing Provider  benzonatate (TESSALON) 100 MG capsule Take 1-2 capsules (100-200 mg total)  by mouth 3 (three) times daily as needed for cough. 11/03/18   Elvina Sidle, MD  clindamycin (CLEOCIN) 150 MG capsule Take 1 capsule (150 mg total) by mouth 3 (three) times daily. 11/03/18   Elvina Sidle, MD  ibuprofen (ADVIL,MOTRIN) 200 MG tablet Take 600 mg by mouth every 6 (six) hours as needed for moderate pain.    [provider]    Family History Family History  Problem Relation Age of Onset  . Diabetes Maternal Grandfather     Social History Social History   Tobacco Use  . Smoking status: Current Every Day Smoker    Packs/day: 0.75    Years: 5.00    Pack years: 3.75    Types: Cigarettes  . Smokeless tobacco: Never Used  Substance Use Topics  . Alcohol use: Yes    Comment: occasionally  . Drug use: No     Allergies   Amoxicillin; Erythromycin; Penicillins; and Wellbutrin [bupropion hcl]   Review of Systems Review of Systems   Physical Exam Triage Vital Signs ED Triage Vitals [11/03/18 1646]  Enc Vitals Group  BP 108/62     Pulse Rate 89     Resp 18     Temp 97.8 F (36.6 C)     Temp Source Temporal     SpO2 100 %     Weight      Height      Head Circumference      Peak Flow      Pain Score 6     Pain Loc      Pain Edu?      Excl. in GC?    No data found.  Updated Vital Signs BP 108/62 (BP Location: Right Arm)   Pulse 89   Temp 97.8 F (36.6 C) (Temporal)   Resp 18   SpO2 100%    Physical Exam Vitals signs and nursing note reviewed.  Constitutional:      Appearance: Normal appearance.  HENT:     Head: Normocephalic.     Right Ear: Tympanic membrane and external ear normal.     Left Ear: Tympanic membrane and external ear normal.     Nose: Congestion present.     Mouth/Throat:     Mouth: Mucous membranes are moist.  Eyes:     Conjunctiva/sclera: Conjunctivae normal.  Neck:     Musculoskeletal: Normal range of motion.  Cardiovascular:     Rate and Rhythm: Normal rate and regular rhythm.     Heart sounds: Normal  heart sounds.  Pulmonary:     Effort: Pulmonary effort is normal.     Breath sounds: Normal breath sounds.  Musculoskeletal: Normal range of motion.  Skin:    General: Skin is warm and dry.  Neurological:     General: No focal deficit present.     Mental Status: She is alert and oriented to person, place, and time.  Psychiatric:        Mood and Affect: Mood normal.      UC Treatments / Results  Labs (all labs ordered are listed, but only abnormal results are displayed) Labs Reviewed - No data to display  EKG None  Radiology No results found.  Procedures Procedures (including critical care time)  Medications Ordered in UC Medications - No data to display  Initial Impression / Assessment and Plan / UC Course  I have reviewed the triage vital signs and the nursing notes.  Pertinent labs & imaging results that were available during my care of the patient were reviewed by me and considered in my medical decision making (see chart for details).    Final Clinical Impressions(s) / UC Diagnoses   Final diagnoses:  Flu-like symptoms  Acute non-recurrent maxillary sinusitis     Discharge Instructions     Afrin nasal spray (oxymetazoline) twice daily for 2 days.    ED Prescriptions    Medication Sig Dispense Auth. Provider   clindamycin (CLEOCIN) 150 MG capsule Take 1 capsule (150 mg total) by mouth 3 (three) times daily. 21 capsule Elvina Sidle, MD   benzonatate (TESSALON) 100 MG capsule Take 1-2 capsules (100-200 mg total) by mouth 3 (three) times daily as needed for cough. 40 capsule Elvina Sidle, MD     Controlled Substance Prescriptions [place] Controlled Substance Registry consulted? Not Applicable   Elvina Sidle, MD 11/03/18 [phone]

## 2018-12-05 DIAGNOSIS — N611 Abscess of the breast and nipple: Secondary | ICD-10-CM | POA: Diagnosis not present

## 2018-12-05 DIAGNOSIS — Z79899 Other long term (current) drug therapy: Secondary | ICD-10-CM | POA: Diagnosis not present

## 2019-02-05 ENCOUNTER — Encounter: Payer: Self-pay | Admitting: Family Medicine

## 2019-02-05 ENCOUNTER — Encounter: Payer: Medicaid Other | Admitting: Family Medicine

## 2019-02-05 NOTE — Progress Notes (Signed)
Patient did not keep appointment today. She may call to reschedule.  

## 2019-03-19 ENCOUNTER — Other Ambulatory Visit: Payer: Self-pay

## 2019-03-19 ENCOUNTER — Emergency Department (HOSPITAL_COMMUNITY)
Admission: EM | Admit: 2019-03-19 | Discharge: 2019-03-19 | Discharge status: Against Medical Advice | Payer: Medicaid Other

## 2019-05-02 ENCOUNTER — Emergency Department (HOSPITAL_COMMUNITY): Payer: Medicaid Other

## 2019-05-02 ENCOUNTER — Encounter (HOSPITAL_COMMUNITY): Payer: Self-pay | Admitting: Emergency Medicine

## 2019-05-02 ENCOUNTER — Other Ambulatory Visit: Payer: Self-pay

## 2019-05-02 ENCOUNTER — Emergency Department (HOSPITAL_COMMUNITY)
Admission: EM | Admit: 2019-05-02 | Discharge: 2019-05-02 | Disposition: A | Discharge status: Home / Self Care | Payer: Medicaid Other | Attending: Emergency Medicine | Admitting: Emergency Medicine

## 2019-05-02 DIAGNOSIS — Y999 Unspecified external cause status: Secondary | ICD-10-CM | POA: Insufficient documentation

## 2019-05-02 DIAGNOSIS — S0101XA Laceration without foreign body of scalp, initial encounter: Secondary | ICD-10-CM

## 2019-05-02 DIAGNOSIS — F17210 Nicotine dependence, cigarettes, uncomplicated: Secondary | ICD-10-CM | POA: Insufficient documentation

## 2019-05-02 DIAGNOSIS — S0003XA Contusion of scalp, initial encounter: Secondary | ICD-10-CM | POA: Diagnosis not present

## 2019-05-02 DIAGNOSIS — S0990XA Unspecified injury of head, initial encounter: Secondary | ICD-10-CM | POA: Diagnosis not present

## 2019-05-02 DIAGNOSIS — W01198A Fall on same level from slipping, tripping and stumbling with subsequent striking against other object, initial encounter: Secondary | ICD-10-CM | POA: Insufficient documentation

## 2019-05-02 DIAGNOSIS — Y92002 Bathroom of unspecified non-institutional (private) residence single-family (private) house as the place of occurrence of the external cause: Secondary | ICD-10-CM | POA: Diagnosis not present

## 2019-05-02 DIAGNOSIS — Y93E1 Activity, personal bathing and showering: Secondary | ICD-10-CM | POA: Insufficient documentation

## 2019-05-02 MED ORDER — ACETAMINOPHEN 500 MG PO TABS
1000.0000 mg | ORAL_TABLET | Freq: Once | ORAL | Status: AC
  Administered 2019-05-02: 1000 mg via ORAL
  Filled 2019-05-02: qty 2

## 2019-05-02 MED ORDER — IBUPROFEN 400 MG PO TABS
400.0000 mg | ORAL_TABLET | Freq: Once | ORAL | Status: AC
  Administered 2019-05-02: 400 mg via ORAL
  Filled 2019-05-02: qty 1

## 2019-05-02 NOTE — ED Triage Notes (Signed)
Patient states she slipped getting out of the shower and hit the back of her head on something. Lac noted to R sideof posterior scalp. No LOC, no dizziness or nausea.

## 2019-05-02 NOTE — Discharge Instructions (Addendum)
Your neurologic examination is within normal limits.  The CT scan of your head is negative for any skull fracture, and negative for any intracranial abnormality.  You have a small laceration on the right scalp area.  This was repaired with 2 staples.  These should be removed in the next 7 or 8 days.  You may see your primary physician, urgent care, and we are happy to see you here at this emergency room to have them removed.  Use Tylenol every 4 hours or ibuprofen every 6 hours for soreness or headache.

## 2019-05-02 NOTE — ED Provider Notes (Signed)
[facility]GENCY [facility] Provider Note   CSN: [id number] Arrival date & time: 05/02/19  1048     History   Chief Complaint Chief Complaint  Patient presents with  . Head Injury    HPI Lynn Nolan is a 28 y.o. female.     Patient is a 28 year old female who presents to the emergency department with complaint of head injury.  The patient states that while getting out of the shower this morning she slipped, hit her head in the shower, and sustained a laceration to the scalp.  The injury happened on the right posterior side of the head and scalp area.  She says that she did not have loss of consciousness, but she was confused for a while after the injury.  She says it is beginning to improve now.  She did not have vomiting.  There was no loss of control of bowel or bladder function.  No vision changes reported.  The patient is able to swallow without problem,  she is able to ambulate without problem.  The history is provided by the patient.  Head Injury Associated symptoms: no nausea, no neck pain, no numbness, no seizures, no tinnitus and no vomiting     Past Medical History:  Diagnosis Date  . Abdominal cramping    intermittent "since BLT"  . Anxiety   . Chronic nausea   . Depression    hx of PPD  . LGA (large for gestational age) fetus    with previous pregnancy  . Previous cesarean section    x2  . Pyelonephritis   . Rh negative, maternal   . Spina bifida in child of prior pregnancy, currently pregnant     Patient Active Problem List   Diagnosis Date Noted  . Chronic nausea 10/03/2017  . History of pyelonephritis 09/06/2013  . History of postpartum depression 09/06/2013    Past Surgical History:  Procedure Laterality Date  . ADENOIDECTOMY    . CESAREAN SECTION  x2  . CESAREAN SECTION WITH BILATERAL TUBAL LIGATION Bilateral 10/21/2013   Procedure: PRIMARY CESAREAN SECTION WITH BILATERAL TUBAL LIGATION;  Surgeon: Esmeralda ArthurSandra A Rivard, MD;  Location:  [facility];  Service: Obstetrics;  Laterality: Bilateral;  filshie clips  . TONSILLECTOMY       OB History    Gravida  3   Para  3   Term  2   Preterm  1   AB      Living  4     SAB      TAB      Ectopic      Multiple  1   Live Births  1            Home Medications    Prior to Admission medications   Medication Sig Start Date End Date Taking? Authorizing Provider  benzonatate (TESSALON) 100 MG capsule Take 1-2 capsules (100-200 mg total) by mouth 3 (three) times daily as needed for cough. 11/03/18   Elvina SidleLauenstein, Kurt, MD  clindamycin (CLEOCIN) 150 MG capsule Take 1 capsule (150 mg total) by mouth 3 (three) times daily. 11/03/18   Elvina SidleLauenstein, Kurt, MD  ibuprofen (ADVIL,MOTRIN) 200 MG tablet Take 600 mg by mouth every 6 (six) hours as needed for moderate pain.    [provider]    Family History Family History  Problem Relation Age of Onset  . Diabetes Maternal Grandfather     Social History Social History   Tobacco Use  . Smoking status: Current  Every Day Smoker    Packs/day: 0.50    Years: 5.00    Pack years: 2.50    Types: Cigarettes  . Smokeless tobacco: Never Used  Substance Use Topics  . Alcohol use: Yes    Comment: occasionally  . Drug use: No     Allergies   Amoxicillin, Erythromycin, Penicillins, and Wellbutrin [bupropion hcl]   Review of Systems Review of Systems  Constitutional: Negative for activity change and appetite change.  HENT: Negative for congestion, ear discharge, ear pain, facial swelling, nosebleeds, rhinorrhea, sneezing and tinnitus.   Eyes: Negative for photophobia, pain and discharge.  Respiratory: Negative for cough, choking, shortness of breath and wheezing.   Cardiovascular: Negative for chest pain, palpitations and leg swelling.  Gastrointestinal: Negative for abdominal pain, blood in stool, constipation, diarrhea, nausea and vomiting.  Genitourinary: Negative for difficulty urinating, dysuria, flank pain,  frequency and hematuria.  Musculoskeletal: Negative for back pain, gait problem, myalgias and neck pain.  Skin: Negative for color change, rash and wound.  Neurological: Negative for dizziness, seizures, syncope, facial asymmetry, speech difficulty, weakness and numbness.  Hematological: Negative for adenopathy. Does not bruise/bleed easily.  Psychiatric/Behavioral: Negative for agitation, confusion, hallucinations, self-injury and suicidal ideas. The patient is nervous/anxious.      Physical Exam Updated Vital Signs BP 126/86 (BP Location: Right Arm)   Pulse (!) 130   Temp 98.5 F (36.9 C) (Oral)   Resp 18   LMP 04/27/2019   SpO2 98%   Physical Exam Vitals signs and nursing note reviewed.  Constitutional:      General: She is not in acute distress.    Appearance: She is well-developed.  HENT:     Head: Normocephalic and atraumatic.      Comments: Neg Battles sign.    Right Ear: External ear normal.     Left Ear: External ear normal.  Eyes:     General: No scleral icterus.       Right eye: No discharge.        Left eye: No discharge.     Conjunctiva/sclera: Conjunctivae normal.  Neck:     Musculoskeletal: Neck supple.     Trachea: No tracheal deviation.  Cardiovascular:     Rate and Rhythm: Normal rate and regular rhythm.  Pulmonary:     Effort: Pulmonary effort is normal. No respiratory distress.     Breath sounds: Normal breath sounds. No stridor. No wheezing or rales.  Abdominal:     General: Bowel sounds are normal. There is no distension.     Palpations: Abdomen is soft.     Tenderness: There is no abdominal tenderness. There is no guarding or rebound.  Musculoskeletal:        General: No tenderness.  Skin:    General: Skin is warm and dry.     Findings: No rash.  Neurological:     Mental Status: She is alert.     Cranial Nerves: No cranial nerve deficit (no facial droop, extraocular movements intact, no slurred speech).     Sensory: No sensory deficit.      Motor: No abnormal muscle tone or seizure activity.     Coordination: Coordination normal.  Psychiatric:        Mood and Affect: Mood is anxious.      ED Treatments / Results  Labs (all labs ordered are listed, but only abnormal results are displayed) Labs Reviewed  POC URINE PREG, ED    EKG None  Radiology No results found.  Procedures .Marland Kitchen.Laceration Repair  Date/Time: 05/02/2019 11:53 AM Performed by: Ivery QualeBryant, Karissa Meenan, PA-C Authorized by: Ivery QualeBryant, Lynita Groseclose, PA-C   Consent:    Consent obtained:  Verbal   Consent given by:  Patient   Risks discussed:  Pain, poor cosmetic result and poor wound healing Universal protocol:    Procedure explained and questions answered to patient or proxy's satisfaction: yes     Immediately prior to procedure, a time out was called: yes     Patient identity confirmed:  Arm band Laceration details:    Location:  Scalp   Scalp location:  R parietal   Length (cm):  1.3 Repair type:    Repair type:  Simple Pre-procedure details:    Preparation:  Patient was prepped and draped in usual sterile fashion Exploration:    Hemostasis achieved with:  Direct pressure   Wound extent: no foreign bodies/material noted   Treatment:    Area cleansed with:  Betadine Skin repair:    Repair method:  Staples   Number of staples:  2 Approximation:    Approximation:  Close Post-procedure details:    Dressing:  Open (no dressing)   Patient tolerance of procedure:  Tolerated well, no immediate complications   (including critical care time)  Medications Ordered in ED Medications  ibuprofen (ADVIL) tablet 400 mg (has no administration in time range)  acetaminophen (TYLENOL) tablet 1,000 mg (has no administration in time range)     Initial Impression / Assessment and Plan / ED Course  I have reviewed the triage vital signs and the nursing notes.  Pertinent labs & imaging results that were available during my care of the patient were reviewed by me and  considered in my medical decision making (see chart for details).          Final Clinical Impressions(s) / ED Diagnoses MDM  Pulse rate is elevated at 130, patient states she is very nervous.  Pulse oximetry is 98% on room air.  Within normal limits by my interpretation.  Patient is awake and alert does not appear to be in distress at this time.  Patient sustained a laceration to the back of the head.  This was repaired with 2 staples.  No gross neurologic deficit appreciated at this time.  CT head scan is negative for intracranial abnormalities.  No fractures appreciated. Patient was made aware of the findings of the CT scan, as well as the findings on the examination.  Have asked her to have the staples removed in 5 to 7 days.  Questions were answered.  Patient is in agreement with the plan.   Final diagnoses:  Contusion of scalp, initial encounter  Laceration of scalp, initial encounter    ED Discharge Orders    None       Ivery QualeBryant, Analea Muller, Cordelia Poche-C 05/03/19 2100    Gerhard MunchLockwood, Robert, MD 05/06/19 [phone]

## 2019-05-23 ENCOUNTER — Ambulatory Visit (HOSPITAL_COMMUNITY)
Admission: EM | Admit: 2019-05-23 | Discharge: 2019-05-23 | Disposition: A | Discharge status: Home / Self Care | Payer: Medicaid Other

## 2019-05-23 ENCOUNTER — Other Ambulatory Visit: Payer: Self-pay

## 2019-05-23 DIAGNOSIS — Z4802 Encounter for removal of sutures: Secondary | ICD-10-CM

## 2019-05-23 NOTE — ED Notes (Signed)
Two (2) staples removed from scalp.  Wound well healed with no sign of infection.

## 2019-10-26 ENCOUNTER — Other Ambulatory Visit: Payer: Self-pay

## 2019-10-26 ENCOUNTER — Ambulatory Visit
Admission: EM | Admit: 2019-10-26 | Discharge: 2019-10-26 | Disposition: A | Discharge status: Home / Self Care | Payer: Medicaid Other | Attending: Emergency Medicine | Admitting: Emergency Medicine

## 2019-10-26 DIAGNOSIS — N939 Abnormal uterine and vaginal bleeding, unspecified: Secondary | ICD-10-CM | POA: Diagnosis not present

## 2019-10-26 LAB — POCT URINALYSIS DIP (MANUAL ENTRY)
Bilirubin, UA: NEGATIVE
Glucose, UA: NEGATIVE mg/dL
Ketones, POC UA: NEGATIVE mg/dL
Leukocytes, UA: NEGATIVE
Nitrite, UA: NEGATIVE
Protein Ur, POC: NEGATIVE mg/dL
Spec Grav, UA: 1.025 (ref 1.010–1.025)
Urobilinogen, UA: 0.2 E.U./dL
pH, UA: 6.5 (ref 5.0–8.0)

## 2019-10-26 LAB — POCT URINE PREGNANCY: Preg Test, Ur: NEGATIVE

## 2019-10-26 MED ORDER — MEGESTROL ACETATE 40 MG PO TABS: 40.0000 mg | ORAL_TABLET | Freq: Every day | ORAL | 0 refills | Status: DC

## 2019-10-26 NOTE — ED Triage Notes (Signed)
Pt presents to UC w/ c/o heavy vaginal bleeding since this morning. Pt states she did not have a period last month. Pt states she is currently having a lot of cramps and a headache since this morning. She's used 4 tampons since this morning.

## 2019-10-26 NOTE — Discharge Instructions (Signed)
POCT urine analysis and pregnancy test were negative Advised patient to take medication as prescribed To follow-up with OB/GYN To go to ED or return for worsening of symptoms

## 2019-10-26 NOTE — ED Provider Notes (Signed)
[facility]    CSN: [id number] Arrival date & time: 10/26/19  1400      History   Chief Complaint Chief Complaint  Patient presents with  . Vaginal bleeding    HPI Lynn Nolan is a 29 y.o. female.   who presents with complaint of vaginal bleeding that started this morning.  Denies trauma (rough intercourse).  Reports she missed her period last month. States her cycle is  irregular and occurs randomly.  LMP: unable to remember the date.  Last unprotected intercourse 5 days ago.  Is NOT trying to conceive.  Stated her tube are clamped.  States initially she had mild bleeding but has experienced heavy bleeding today.  Has soaked through 4 tampons today, which is  abnormal for her usual menstrual cycle.  Denies aggravating factors. Has NOT taken a home pregnancy test.   Denies similar symptoms in the past. She denies fever, chills, fatigue, breast tenderness, lightheadedness, dizziness, tachycardia, SOB, abdominal or pelvic pain, dysuria, increased urinary frequency or urgency, abnormal vaginal discharge, dyspareunia.    The history is provided by the patient. No language interpreter was used.    Past Medical History:  Diagnosis Date  . Abdominal cramping    intermittent "since BLT"  . Anxiety   . Chronic nausea   . Depression    hx of PPD  . LGA (large for gestational age) fetus    with previous pregnancy  . Previous cesarean section    x2  . Pyelonephritis   . Rh negative, maternal   . Spina bifida in child of prior pregnancy, currently pregnant     Patient Active Problem List   Diagnosis Date Noted  . Chronic nausea 10/03/2017  . History of pyelonephritis 09/06/2013  . History of postpartum depression 09/06/2013    Past Surgical History:  Procedure Laterality Date  . ADENOIDECTOMY    . CESAREAN SECTION  x2  . CESAREAN SECTION WITH BILATERAL TUBAL LIGATION Bilateral 10/21/2013   Procedure: PRIMARY CESAREAN SECTION WITH BILATERAL TUBAL LIGATION;   Surgeon: Alwyn Pea, MD;  Location: [facility];  Service: Obstetrics;  Laterality: Bilateral;  filshie clips  . TONSILLECTOMY      OB History    Gravida  3   Para  3   Term  2   Preterm  1   AB      Living  4     SAB      TAB      Ectopic      Multiple  1   Live Births  1            Home Medications    Prior to Admission medications   Medication Sig Start Date End Date Taking? Authorizing Provider  ibuprofen (ADVIL,MOTRIN) 200 MG tablet Take 600 mg by mouth every 6 (six) hours as needed for moderate pain.    [provider]  megestrol (MEGACE) 40 MG tablet Take 1 tablet (40 mg total) by mouth daily. 10/26/19   AvegnoDarrelyn Hillock, FNP    Family History Family History  Problem Relation Age of Onset  . Diabetes Maternal Grandfather   . Healthy Mother     Social History Social History   Tobacco Use  . Smoking status: Current Every Day Smoker    Packs/day: 0.50    Years: 5.00    Pack years: 2.50    Types: Cigarettes  . Smokeless tobacco: Never Used  Substance Use Topics  . Alcohol use: Yes  Comment: occasionally  . Drug use: No     Allergies   Amoxicillin, Erythromycin, Penicillins, and Wellbutrin [bupropion hcl]   Review of Systems Review of Systems  Constitutional: Negative.   Respiratory: Negative.   Cardiovascular: Negative.   Genitourinary:       Vaginal bleeding  All other systems reviewed and are negative.    Physical Exam Triage Vital Signs ED Triage Vitals  Enc Vitals Group     BP      Pulse      Resp      Temp      Temp src      SpO2      Weight      Height      Head Circumference      Peak Flow      Pain Score      Pain Loc      Pain Edu?      Excl. in GC?    No data found.  Updated Vital Signs BP 114/73 (BP Location: Right Arm)   Pulse 79   Temp 97.9 F (36.6 C) (Oral)   Resp 16   LMP 10/26/2019 Comment: irregular periods  SpO2 98%   Visual Acuity Right Eye Distance:   Left Eye  Distance:   Bilateral Distance:    Right Eye Near:   Left Eye Near:    Bilateral Near:     Physical Exam Vitals and nursing note reviewed.  Constitutional:      General: She is not in acute distress.    Appearance: Normal appearance. She is normal weight. She is not toxic-appearing or diaphoretic.  Cardiovascular:     Rate and Rhythm: Normal rate and regular rhythm.     Pulses: Normal pulses.     Heart sounds: Normal heart sounds. No murmur. No gallop.   Pulmonary:     Effort: Pulmonary effort is normal. No respiratory distress.     Breath sounds: Normal breath sounds. No stridor. No wheezing, rhonchi or rales.  Chest:     Chest wall: No tenderness.  Abdominal:     General: Abdomen is flat. Bowel sounds are normal. There is no distension.     Palpations: Abdomen is soft. There is no mass.     Tenderness: There is no abdominal tenderness. There is no right CVA tenderness, left CVA tenderness, guarding or rebound.     Hernia: No hernia is present.  Neurological:     Mental Status: She is alert.      UC Treatments / Results  Labs (all labs ordered are listed, but only abnormal results are displayed) Labs Reviewed  POCT URINALYSIS DIP (MANUAL ENTRY) - Abnormal; Notable for the following components:      Result Value   Blood, UA trace-intact (*)    All other components within normal limits  POCT URINE PREGNANCY    EKG   Radiology No results found.  Procedures Procedures (including critical care time)  Medications Ordered in UC Medications - No data to display  Initial Impression / Assessment and Plan / UC Course  I have reviewed the triage vital signs and the nursing notes.  Pertinent labs & imaging results that were available during my care of the patient were reviewed by me and considered in my medical decision making (see chart for details).    Vaginal bleeding POCT  urinalysis was negative for UTI POCT pregnancy test was negative Patient is  hemodynamically stable  Megace will be prescribed Work note  was given Advised patient to follow-up with OB/GYN To return for worsening of symptoms  Final Clinical Impressions(s) / UC Diagnoses   Final diagnoses:  Vaginal bleeding     Discharge Instructions     POCT urine analysis and pregnancy test were negative Advised patient to take medication as prescribed To follow-up with OB/GYN To go to ED or return for worsening of symptoms    ED Prescriptions    Medication Sig Dispense Auth. Provider   megestrol (MEGACE) 40 MG tablet Take 1 tablet (40 mg total) by mouth daily. 14 tablet Antionette Luster, Zachery Dakins, FNP     PDMP not reviewed this encounter.   Durward Parcel, FNP 10/26/19 1451

## 2019-11-15 ENCOUNTER — Other Ambulatory Visit: Payer: Self-pay

## 2019-11-15 ENCOUNTER — Ambulatory Visit
Admission: EM | Admit: 2019-11-15 | Discharge: 2019-11-15 | Disposition: A | Discharge status: Home / Self Care | Payer: Medicaid Other | Attending: Emergency Medicine | Admitting: Emergency Medicine

## 2019-11-15 DIAGNOSIS — R200 Anesthesia of skin: Secondary | ICD-10-CM | POA: Diagnosis not present

## 2019-11-15 MED ORDER — NAPROXEN 375 MG PO TABS: 375.0000 mg | ORAL_TABLET | Freq: Two times a day (BID) | ORAL | 0 refills | Status: DC

## 2019-11-15 NOTE — ED Provider Notes (Signed)
[facility]ARE CENTER   [id number] 11/15/19 Arrival Time: 0903  CC: RT toe numbness  SUBJECTIVE: History from: patient. Lynn Nolan is a 29 y.o. female complains of right 4th toe numbness x 2 weeks.  Denies a precipitating event or specific injury, however, does admit to working long hours on her feet.  Localizes the numbness to the fourth toe of RT foot.  Denies pain.  Has NOT tried OTC medications.  Denies aggravating factors.  Denies similar symptoms in the past.  Denies fever, chills, erythema, ecchymosis, effusion, weakness.    ROS: As per HPI.  All other pertinent ROS negative.     Past Medical History:  Diagnosis Date  . Abdominal cramping    intermittent "since BLT"  . Anxiety   . Chronic nausea   . Depression    hx of PPD  . LGA (large for gestational age) fetus    with previous pregnancy  . Previous cesarean section    x2  . Pyelonephritis   . Rh negative, maternal   . Spina bifida in child of prior pregnancy, currently pregnant    Past Surgical History:  Procedure Laterality Date  . ADENOIDECTOMY    . CESAREAN SECTION  x2  . CESAREAN SECTION WITH BILATERAL TUBAL LIGATION Bilateral 10/21/2013   Procedure: PRIMARY CESAREAN SECTION WITH BILATERAL TUBAL LIGATION;  Surgeon: Esmeralda Arthur, MD;  Location: [facility];  Service: Obstetrics;  Laterality: Bilateral;  filshie clips  . TONSILLECTOMY     Allergies  Allergen Reactions  . Amoxicillin Other (See Comments)    Childhood allergy  . Erythromycin     Childhood allergy  . Penicillins Other (See Comments)    Has patient had a PCN reaction causing immediate rash, facial/tongue/throat swelling, SOB or lightheadedness with hypotension: unknown Has patient had a PCN reaction causing severe rash involving mucus membranes or skin necrosis:unknown} Has patient had a PCN reaction that required hospitalization:  unknown Has patient had a PCN reaction occurring within the last 10 years: unknown  . Wellbutrin [Bupropion  Hcl] Nausea Only and Other (See Comments)    Dizziness   No current facility-administered medications on file prior to encounter.   Current Outpatient Medications on File Prior to Encounter  Medication Sig Dispense Refill  . ibuprofen (ADVIL,MOTRIN) 200 MG tablet Take 600 mg by mouth every 6 (six) hours as needed for moderate pain.     Social History   Socioeconomic History  . Marital status: Single    Spouse name: Not on file  . Number of children: Not on file  . Years of education: Not on file  . Highest education level: Not on file  Occupational History  . Not on file  Tobacco Use  . Smoking status: Current Every Day Smoker    Packs/day: 0.50    Years: 5.00    Pack years: 2.50    Types: Cigarettes  . Smokeless tobacco: Never Used  Substance and Sexual Activity  . Alcohol use: Yes    Comment: occasionally  . Drug use: No  . Sexual activity: Yes    Birth control/protection: Surgical  Other Topics Concern  . Not on file  Social History Narrative  . Not on file   Social Determinants of Health   Financial Resource Strain:   . Difficulty of Paying Living Expenses: Not on file  Food Insecurity:   . Worried About Programme researcher, broadcasting/film/video in the Last Year: Not on file  . Ran Out of Food in the Last  Year: Not on file  Transportation Needs:   . Lack of Transportation (Medical): Not on file  . Lack of Transportation (Non-Medical): Not on file  Physical Activity:   . Days of Exercise per Week: Not on file  . Minutes of Exercise per Session: Not on file  Stress:   . Feeling of Stress : Not on file  Social Connections:   . Frequency of Communication with Friends and Family: Not on file  . Frequency of Social Gatherings with Friends and Family: Not on file  . Attends Religious Services: Not on file  . Active Member of Clubs or Organizations: Not on file  . Attends Archivist Meetings: Not on file  . Marital Status: Not on file  Intimate Partner Violence:   . Fear  of Current or Ex-Partner: Not on file  . Emotionally Abused: Not on file  . Physically Abused: Not on file  . Sexually Abused: Not on file   Family History  Problem Relation Age of Onset  . Diabetes Maternal Grandfather   . Healthy Mother     OBJECTIVE:  Vitals:   11/15/19 0912  BP: 117/72  Pulse: 87  Resp: 16  Temp: 98.7 F (37.1 C)  TempSrc: Oral  SpO2: 98%    General appearance: ALERT; in no acute distress.  Head: NCAT Lungs: Normal respiratory effort CV: Dorsalis pedis pulses 2+ . Cap refill < 2 seconds Musculoskeletal: RT foot Inspection: Skin warm, dry, clear and intact without obvious erythema, effusion, or ecchymosis.  Palpation: Nontender to palpation ROM: FROM active and passive Strength: 5/5 dorsiflexion, 5/5 plantar flexion Special test: Negative tinels sign Skin: warm and dry Neurologic: Ambulates without difficulty; Sensation decreased over ventral aspect of RT 4th toe; otherwise sensation intact about the RLE  Psychological: alert and cooperative; normal mood and affect   ASSESSMENT & PLAN:  1. Numbness of toes     Meds ordered this encounter  Medications  . naproxen (NAPROSYN) 375 MG tablet    Sig: Take 1 tablet (375 mg total) by mouth 2 (two) times daily.    Dispense:  20 tablet    Refill:  0    Order Specific Question:   Supervising Provider    Answer:   Raylene Everts [[id number]]    Continue conservative management of rest, gentle stretches, and elevation Take naproxen as needed for pain relief (may cause abdominal discomfort, ulcers, and GI bleeds avoid taking with other NSAIDs) Follow up with foot/ ankle specialist if symptoms persist Return or go to the ER if you have any new or worsening symptoms (fever, chills, chest pain, skin changes, swelling, redness, worsening symptoms despite medication, etc...)   Reviewed expectations re: course of current medical issues. Questions answered. Outlined signs and symptoms indicating need for  more acute intervention. Patient verbalized understanding. After Visit Summary given.    Will, Schier, PA-C 11/15/19 [phone]

## 2019-11-15 NOTE — ED Triage Notes (Signed)
Pt presents to UC w/ c/o right 4 toe numbness x2 weeks. Pt states it started at tip of toe and has gradually progressed toward foot. Pt states she works long shifts on her feet. No discoloration of toe.

## 2019-11-15 NOTE — Discharge Instructions (Addendum)
Continue conservative management of rest, gentle stretches, and elevation Take naproxen as needed for pain relief (may cause abdominal discomfort, ulcers, and GI bleeds avoid taking with other NSAIDs) Follow up with foot/ ankle specialist if symptoms persist Return or go to the ER if you have any new or worsening symptoms (fever, chills, chest pain, skin changes, swelling, redness, worsening symptoms despite medication, etc...)

## 2020-02-19 ENCOUNTER — Ambulatory Visit
Admission: EM | Admit: 2020-02-19 | Discharge: 2020-02-19 | Discharge status: Against Medical Advice | Payer: Medicaid Other

## 2020-02-19 ENCOUNTER — Other Ambulatory Visit: Payer: Self-pay

## 2020-02-19 NOTE — ED Notes (Signed)
Patient not in lobby, attempted to call patient x 6 without answer.

## 2020-07-21 ENCOUNTER — Ambulatory Visit
Admission: RE | Admit: 2020-07-21 | Discharge: 2020-07-21 | Disposition: A | Discharge status: Home / Self Care | Payer: Medicaid Other | Source: Ambulatory Visit | Attending: Emergency Medicine | Admitting: Emergency Medicine

## 2020-07-21 ENCOUNTER — Other Ambulatory Visit: Payer: Self-pay

## 2020-07-21 NOTE — ED Notes (Signed)
Pt has decided she does not want cyto swab

## 2020-07-21 NOTE — ED Triage Notes (Signed)
Pt states partner tested possitive for HPV yesterday and wants testing

## 2020-07-21 NOTE — ED Provider Notes (Signed)
Patient requests HPV testing.  This is not a service we provide at urgent care.  Would like to follow up with health dept for pap smear.     Hadasa, Gasner, PA-C 07/21/20 1128

## 2020-08-04 ENCOUNTER — Emergency Department (HOSPITAL_COMMUNITY)
Admission: EM | Admit: 2020-08-04 | Discharge: 2020-08-04 | Disposition: A | Discharge status: Home / Self Care | Payer: Medicaid Other | Attending: Emergency Medicine | Admitting: Emergency Medicine

## 2020-08-04 ENCOUNTER — Ambulatory Visit
Admission: EM | Admit: 2020-08-04 | Discharge: 2020-08-04 | Disposition: A | Discharge status: Home / Self Care | Payer: Medicaid Other | Attending: Emergency Medicine | Admitting: Emergency Medicine

## 2020-08-04 ENCOUNTER — Encounter (HOSPITAL_COMMUNITY): Payer: Self-pay | Admitting: *Deleted

## 2020-08-04 ENCOUNTER — Other Ambulatory Visit: Payer: Self-pay

## 2020-08-04 ENCOUNTER — Emergency Department (HOSPITAL_COMMUNITY): Payer: Medicaid Other

## 2020-08-04 DIAGNOSIS — F17210 Nicotine dependence, cigarettes, uncomplicated: Secondary | ICD-10-CM | POA: Insufficient documentation

## 2020-08-04 DIAGNOSIS — Z20822 Contact with and (suspected) exposure to covid-19: Secondary | ICD-10-CM | POA: Diagnosis not present

## 2020-08-04 DIAGNOSIS — R079 Chest pain, unspecified: Secondary | ICD-10-CM | POA: Insufficient documentation

## 2020-08-04 DIAGNOSIS — R6889 Other general symptoms and signs: Secondary | ICD-10-CM | POA: Insufficient documentation

## 2020-08-04 DIAGNOSIS — R0789 Other chest pain: Secondary | ICD-10-CM | POA: Diagnosis not present

## 2020-08-04 LAB — POCT URINALYSIS DIP (MANUAL ENTRY)
Bilirubin, UA: NEGATIVE
Blood, UA: NEGATIVE
Glucose, UA: NEGATIVE mg/dL
Ketones, POC UA: NEGATIVE mg/dL
Leukocytes, UA: NEGATIVE
Nitrite, UA: NEGATIVE
Protein Ur, POC: NEGATIVE mg/dL
Spec Grav, UA: >=1.03 — AB (ref 1.010–1.025)
Urobilinogen, UA: 0.2 E.U./dL
pH, UA: 6 (ref 5.0–8.0)

## 2020-08-04 LAB — POCT URINE PREGNANCY: Preg Test, Ur: NEGATIVE

## 2020-08-04 LAB — RESP PANEL BY RT-PCR (FLU A&B, COVID) ARPGX2
Influenza A by PCR: NEGATIVE
Influenza B by PCR: NEGATIVE
SARS Coronavirus 2 by RT PCR: NEGATIVE

## 2020-08-04 MED ORDER — NAPROXEN 500 MG PO TABS: 500.0000 mg | ORAL_TABLET | Freq: Two times a day (BID) | ORAL | 0 refills | Status: DC

## 2020-08-04 NOTE — Discharge Instructions (Signed)
Your urinary test was negative for any signs of infection Your chest x-ray is totally normal with no signs of pneumonia Your EKG shows no signs of heart problems Your Covid test is negative  Take naproxen twice a day as needed for pain, see your doctor in follow-up, ER for worsening symptoms

## 2020-08-04 NOTE — ED Provider Notes (Signed)
[facility] EMERGENCY DEPARTMENT Provider Note   CSN: [id number] Arrival date & time: 08/04/20  1214     History Chief Complaint  Patient presents with   Chest Pain    Lynn Nolan is a 29 y.o. female.  HPI   This patient is a 29 year old female, she does have a history of anxiety and depression, she does have a history of pyelonephritis, she has never had any chronic medical conditions for which she takes medications and at this time takes no daily medications.  She reports that approximately 1 week ago she started to develop some dark-colored urine, and had a metallic smell to it and now states that all of her smell and taste is also got a metallic flavor to it.  She has some lower back pain, no abdominal pain, 3 days ago she developed chest pain which has been constant, dull, achy and causes her to have tingling in all 4 extremities.  No headache, no fever but she does have some chills intermittently.  She was evaluated at the urgent care this morning, she had an urinalysis which was negative for infection, specific gravity was elevated, pregnancy test was negative.  Past Medical History:  Diagnosis Date   Abdominal cramping    intermittent "since BLT"   Anxiety    Chronic nausea    Depression    hx of PPD   LGA (large for gestational age) fetus    with previous pregnancy   Previous cesarean section    x2   Pyelonephritis    Rh negative, maternal    Spina bifida in child of prior pregnancy, currently pregnant     Patient Active Problem List   Diagnosis Date Noted   Chronic nausea 10/03/2017   History of pyelonephritis 09/06/2013   History of postpartum depression 09/06/2013    Past Surgical History:  Procedure Laterality Date   ADENOIDECTOMY     CESAREAN SECTION  x2   CESAREAN SECTION WITH BILATERAL TUBAL LIGATION Bilateral 10/21/2013   Procedure: PRIMARY CESAREAN SECTION WITH BILATERAL TUBAL LIGATION;  Surgeon: Esmeralda Arthur, MD;   Location: [facility];  Service: Obstetrics;  Laterality: Bilateral;  filshie clips   TONSILLECTOMY       OB History    Gravida  3   Para  3   Term  2   Preterm  1   AB      Living  4     SAB      TAB      Ectopic      Multiple  1   Live Births  1           Family History  Problem Relation Age of Onset   Diabetes Maternal Grandfather    Healthy Mother     Social History   Tobacco Use   Smoking status: Current Every Day Smoker    Packs/day: 1.00    Years: 5.00    Pack years: 5.00    Types: Cigarettes   Smokeless tobacco: Never Used  Building services engineer Use: Never used  Substance Use Topics   Alcohol use: Yes    Comment: occasionally   Drug use: No    Home Medications Prior to Admission medications   Medication Sig Start Date End Date Taking? Authorizing Provider  naproxen (NAPROSYN) 500 MG tablet Take 1 tablet (500 mg total) by mouth 2 (two) times daily. 08/04/20   Eber Hong, MD    Allergies    Amoxicillin,  Erythromycin, Penicillins, and Wellbutrin [bupropion hcl]  Review of Systems   Review of Systems  Constitutional: Negative for chills.  Eyes: Negative for pain and redness.  Respiratory: Negative for cough and shortness of breath.   Cardiovascular: Positive for chest pain.  Gastrointestinal: Negative for nausea and vomiting.  Genitourinary: Negative for dysuria.  Musculoskeletal: Positive for back pain. Negative for neck pain.  Skin: Negative for rash.  Neurological: Positive for numbness. Negative for weakness and headaches.  All other systems reviewed and are negative.   Physical Exam Updated Vital Signs BP (!) 118/96    Pulse 67    Temp 98.3 F (36.8 C) (Oral)    Resp 16    Ht 1.575 m (5\' 2" )    Wt 59 kg    LMP 07/06/2020    SpO2 99%    BMI 23.78 kg/m   Physical Exam Vitals and nursing note reviewed.  Constitutional:      General: She is not in acute distress.    Appearance: She is well-developed.  HENT:     Head:  Normocephalic and atraumatic.     Mouth/Throat:     Pharynx: No oropharyngeal exudate.  Eyes:     General: No scleral icterus.       Right eye: No discharge.        Left eye: No discharge.     Conjunctiva/sclera: Conjunctivae normal.     Pupils: Pupils are equal, round, and reactive to light.  Neck:     Thyroid: No thyromegaly.     Vascular: No JVD.  Cardiovascular:     Rate and Rhythm: Normal rate and regular rhythm.     Heart sounds: Normal heart sounds. No murmur heard.  No friction rub. No gallop.   Pulmonary:     Effort: Pulmonary effort is normal. No respiratory distress.     Breath sounds: Normal breath sounds. No wheezing or rales.  Abdominal:     General: Bowel sounds are normal. There is no distension.     Palpations: Abdomen is soft. There is no mass.     Tenderness: There is no abdominal tenderness.  Musculoskeletal:        General: No tenderness. Normal range of motion.     Cervical back: Normal range of motion and neck supple.  Lymphadenopathy:     Cervical: No cervical adenopathy.  Skin:    General: Skin is warm and dry.     Findings: No erythema or rash.  Neurological:     Mental Status: She is alert.     Coordination: Coordination normal.  Psychiatric:        Behavior: Behavior normal.     ED Results / Procedures / Treatments   Labs (all labs ordered are listed, but only abnormal results are displayed) Labs Reviewed  URINE CULTURE  RESP PANEL BY RT-PCR (FLU A&B, COVID) [id number]    EKG None  Radiology No results found.  Procedures Procedures (including critical care time)  Medications Ordered in ED Medications - No data to display  ED Course  I have reviewed the triage vital signs and the nursing notes.  Pertinent labs & imaging results that were available during my care of the patient were reviewed by me and considered in my medical decision making (see chart for details).    MDM Rules/Calculators/A&P                          This  patient had a  urinalysis which was negative for infection or pregnancy at the urgent care this morning, I can view those results.  I do not think this needs to be repeated.  She has had some chest discomfort with some chills, I will obtain a Covid test and a chest x-ray, the patient is agreeable.  Otherwise she is not tachycardic, not febrile, not hypoxic.  She is otherwise stable appearing, this may be in part related to anxiety.  W/u negative Pt appears stable for d/c She is agreeabgle and understands results and need for f/u.  Final Clinical Impression(s) / ED Diagnoses Final diagnoses:  Chest pain, unspecified type    Rx / DC Orders ED Discharge Orders         Ordered    naproxen (NAPROSYN) 500 MG tablet  2 times daily        08/04/20 1428           Eber Hong, MD 08/07/20 1510

## 2020-08-04 NOTE — ED Triage Notes (Signed)
Pt with dark urine and foul odor for past 4-5 days.  Pt states she has that smell all the time now.  Pt CP for past 3 days on left that radiates down left arm with pins and needle pain.  Pins and needle to right leg as well.  + SOB per pt. +nausea but denies emesis, +mild diarrhea per pt.

## 2020-08-04 NOTE — ED Provider Notes (Addendum)
[facility] CARE CENTER   [id number] 08/04/20 Arrival Time: 1007   CC: CHEST PAIN  SUBJECTIVE:  Lynn Nolan is a 29 y.o. female who presents with complaint of abrupt chest pain/ tightness that began 3 days ago.  Denies a precipitating event, trauma, recent lower respiratory tract, or strenuous upper body activities.  Localizes chest pain to the LT sided.  Describes as worsening, that is intermittent (with episodes last 1-2 hours) and tightness in character.  Rates pain as 8/10.   Has NOT tried OTC medications without relief.  Symptoms made worse with standing from a seated position.  Denies radiates symptoms.  Denies previous symptoms in the past.  Complains of chills, lightheadedness, dizziness, palpitations, SOB, nausea, diarrhea, numbness/ tingling in LT arm, back pain, malodorous urine, and dark urine, and anxiety. Denies fever, chills, tachycardia, vomiting, abdominal pain, diaphoresis, peripheral edema.  Complains of RT calf pain, tobacco use.    Denies calf pain or swelling, recent long travel, recent surgery, pregnancy, malignancy, tobacco use, hormone use, or previous blood clot  Father had MI at age 20   ROS: As per HPI.  All other pertinent ROS negative.    Past Medical History:  Diagnosis Date  . Abdominal cramping    intermittent "since BLT"  . Anxiety   . Chronic nausea   . Depression    hx of PPD  . LGA (large for gestational age) fetus    with previous pregnancy  . Previous cesarean section    x2  . Pyelonephritis   . Rh negative, maternal   . Spina bifida in child of prior pregnancy, currently pregnant    Past Surgical History:  Procedure Laterality Date  . ADENOIDECTOMY    . CESAREAN SECTION  x2  . CESAREAN SECTION WITH BILATERAL TUBAL LIGATION Bilateral 10/21/2013   Procedure: PRIMARY CESAREAN SECTION WITH BILATERAL TUBAL LIGATION;  Surgeon: Esmeralda Arthur, MD;  Location: [facility];  Service: Obstetrics;  Laterality: Bilateral;  filshie clips  .  TONSILLECTOMY     Allergies  Allergen Reactions  . Amoxicillin Other (See Comments)    Childhood allergy  . Erythromycin     Childhood allergy  . Penicillins Other (See Comments)    Has patient had a PCN reaction causing immediate rash, facial/tongue/throat swelling, SOB or lightheadedness with hypotension: unknown Has patient had a PCN reaction causing severe rash involving mucus membranes or skin necrosis:unknown} Has patient had a PCN reaction that required hospitalization:  unknown Has patient had a PCN reaction occurring within the last 10 years: unknown  . Wellbutrin [Bupropion Hcl] Nausea Only and Other (See Comments)    Dizziness   No current facility-administered medications on file prior to encounter.   Current Outpatient Medications on File Prior to Encounter  Medication Sig Dispense Refill  . ibuprofen (ADVIL,MOTRIN) 200 MG tablet Take 600 mg by mouth every 6 (six) hours as needed for moderate pain.     Social History   Socioeconomic History  . Marital status: Single    Spouse name: Not on file  . Number of children: Not on file  . Years of education: Not on file  . Highest education level: Not on file  Occupational History  . Not on file  Tobacco Use  . Smoking status: Current Every Day Smoker    Packs/day: 0.50    Years: 5.00    Pack years: 2.50    Types: Cigarettes  . Smokeless tobacco: Never Used  Vaping Use  . Vaping Use: Never used  Substance and Sexual Activity  . Alcohol use: Yes    Comment: occasionally  . Drug use: No  . Sexual activity: Yes    Birth control/protection: Surgical  Other Topics Concern  . Not on file  Social History Narrative  . Not on file   Social Determinants of Health   Financial Resource Strain:   . Difficulty of Paying Living Expenses: Not on file  Food Insecurity:   . Worried About Programme researcher, broadcasting/film/video in the Last Year: Not on file  . Ran Out of Food in the Last Year: Not on file  Transportation Needs:   . Lack of  Transportation (Medical): Not on file  . Lack of Transportation (Non-Medical): Not on file  Physical Activity:   . Days of Exercise per Week: Not on file  . Minutes of Exercise per Session: Not on file  Stress:   . Feeling of Stress : Not on file  Social Connections:   . Frequency of Communication with Friends and Family: Not on file  . Frequency of Social Gatherings with Friends and Family: Not on file  . Attends Religious Services: Not on file  . Active Member of Clubs or Organizations: Not on file  . Attends Banker Meetings: Not on file  . Marital Status: Not on file  Intimate Partner Violence:   . Fear of Current or Ex-Partner: Not on file  . Emotionally Abused: Not on file  . Physically Abused: Not on file  . Sexually Abused: Not on file   Family History  Problem Relation Age of Onset  . Diabetes Maternal Grandfather   . Healthy Mother      OBJECTIVE:  Vitals:   08/04/20 1024 08/04/20 1026  BP:  136/74  Pulse:  88  Temp:  98.1 F (36.7 C)  TempSrc:  Oral  SpO2:  98%  Weight: 130 lb (59 kg)   Height: 5\' 2"  (1.575 m)     General appearance: alert; no distress Eyes: PERRLA; EOMI; conjunctiva normal HENT: normocephalic; atraumatic; oropharynx clear Neck: supple Lungs: clear to auscultation bilaterally without adventitious breath sounds Heart: regular rate and rhythm.  Clear S1 and S2 without rubs, gallops, or murmur. Abdomen: soft, non-tender; bowel sounds normal; no guarding Extremities: no cyanosis or edema; symmetrical with no gross deformities Skin: warm and dry Psychological: alert and cooperative; normal mood and affect  ECG: Orders placed or performed during the hospital encounter of 08/04/20  . ED EKG  . ED EKG   EKG normal sinus rhythm without ST elevations, depressions, or prolonged PR interval.  No narrowing or widening of the QRS complexes.    LABS:  Results for orders placed or performed during the hospital encounter of 08/04/20    POCT urinalysis dipstick  Result Value Ref Range   Color, UA yellow yellow   Clarity, UA clear clear   Glucose, UA negative negative mg/dL   Bilirubin, UA negative negative   Ketones, POC UA negative negative mg/dL   Spec Grav, UA 08/06/20 (A) 1.010 - 1.025   Blood, UA negative negative   pH, UA 6.0 5.0 - 8.0   Protein Ur, POC negative negative mg/dL   Urobilinogen, UA 0.2 0.2 or 1.0 E.U./dL   Nitrite, UA Negative Negative   Leukocytes, UA Negative Negative  POCT urine pregnancy  Result Value Ref Range   Preg Test, Ur Negative Negative   Labs Reviewed  POCT URINALYSIS DIP (MANUAL ENTRY) - Abnormal; Notable for the following components:  Result Value   Spec Grav, UA >=1.030 (*)    All other components within normal limits  URINE CULTURE  NOVEL CORONAVIRUS, NAA  POCT URINE PREGNANCY     ASSESSMENT & PLAN:  1. Multiple complaints   2. Chest pain, unspecified type    Recommending further evaluation and management in the ED for chest pain.  Cannot rule out heart attack or blood clot in urgent care setting.  Patient aware and in agreement with plan.     Lillee, Mooneyhan, PA-C 08/04/20 1138    Alvino Chapel Crewe, PA-C 08/04/20 1139

## 2020-08-04 NOTE — ED Triage Notes (Signed)
Chest pain x2 days. Smell and taste buds x4 days

## 2020-08-04 NOTE — Discharge Instructions (Signed)
Recommending further evaluation and management in the ED for chest pain.  Cannot rule out heart attack or blood clot in urgent care setting.  Patient aware and in agreement with plan.

## 2020-08-04 NOTE — ED Triage Notes (Signed)
Pt states that she has some lower back pain, urine has an odor and is dark. Back pain-2 weeks. Urinary symptoms x4 days. Pt states that she is also having some chest pain with a weird taste and smell. Pt states that she had chills last night and has some chest congestion. Pt states that she is not vaccinated.

## 2020-08-05 ENCOUNTER — Telehealth: Payer: Self-pay | Admitting: *Deleted

## 2020-08-05 NOTE — Telephone Encounter (Addendum)
Transition Care Management Unsuccessful Follow-up Telephone Call  Date of discharge and from where:  08/04/20 - [facility]  Attempts:  1st Attempt  Reason for unsuccessful TCM follow-up call:  Left voice message

## 2020-08-06 LAB — URINE CULTURE: Culture: NO GROWTH

## 2020-08-06 LAB — NOVEL CORONAVIRUS, NAA: SARS-CoV-2, NAA: NOT DETECTED

## 2020-08-06 LAB — SARS-COV-2, NAA 2 DAY TAT

## 2020-08-07 NOTE — Telephone Encounter (Signed)
Transition Care Management Follow-up Telephone Call  Date of discharge and from where: 08/04/2020 from [facility] ED  How have you been since you were released from the hospital? Pt is still having symptoms that caused her to go to the ED.   Any questions or concerns? No  Items Reviewed:  Did the pt receive and understand the discharge instructions provided? Yes   Medications obtained and verified? Yes   Other? No   Any new allergies since your discharge? No   Dietary orders reviewed? N/A  Do you have support at home? Yes   Functional Questionnaire: (I = Independent and D = Dependent) ADLs: I  Bathing/Dressing- I  Meal Prep- I  Eating- I  Maintaining continence- I  Transferring/Ambulation- I  Managing Meds- I  Follow up appointments reviewed:   PCP Hospital f/u appt confirmed? No  Pt is interested in establishing with a PCP.   Are transportation arrangements needed? No  If their condition worsens, is the pt aware to call PCP or go to the Emergency Dept.? Yes Was the patient provided with contact information for the PCP's office or ED? Yes Was to pt encouraged to call back with questions or concerns? Yes

## 2020-11-29 DIAGNOSIS — R58 Hemorrhage, not elsewhere classified: Secondary | ICD-10-CM | POA: Diagnosis not present

## 2020-12-22 ENCOUNTER — Ambulatory Visit: Payer: Self-pay

## 2020-12-22 ENCOUNTER — Other Ambulatory Visit: Payer: Self-pay

## 2020-12-22 ENCOUNTER — Ambulatory Visit
Admission: RE | Admit: 2020-12-22 | Discharge: 2020-12-22 | Disposition: A | Discharge status: Home / Self Care | Payer: Medicaid Other | Source: Ambulatory Visit | Attending: Emergency Medicine | Admitting: Emergency Medicine

## 2020-12-22 VITALS — BP 114/79 | HR 82 | Temp 98.3°F | Resp 18

## 2020-12-22 DIAGNOSIS — R0981 Nasal congestion: Secondary | ICD-10-CM | POA: Diagnosis not present

## 2020-12-22 DIAGNOSIS — R059 Cough, unspecified: Secondary | ICD-10-CM

## 2020-12-22 DIAGNOSIS — J0140 Acute pansinusitis, unspecified: Secondary | ICD-10-CM

## 2020-12-22 MED ORDER — BENZONATATE 100 MG PO CAPS: 100.0000 mg | ORAL_CAPSULE | Freq: Three times a day (TID) | ORAL | 0 refills | Status: AC

## 2020-12-22 MED ORDER — DOXYCYCLINE HYCLATE 100 MG PO CAPS: 100.0000 mg | ORAL_CAPSULE | Freq: Two times a day (BID) | ORAL | 0 refills | Status: DC

## 2020-12-22 NOTE — Discharge Instructions (Addendum)
Rest and push fluids Tessalon prescribed.  Use daily for symptomatic relief Doxycycline prescribed.  Take as directed and to completion Continue with OTC ibuprofen/tylenol as needed for pain Follow up with PCP as needed Return or go to the ED if you have any new or worsening symptoms such as fever, chills, worsening sinus pain/pressure, cough, sore throat, chest pain, shortness of breath, abdominal pain, changes in bowel or bladder habits, etc..Marland Kitchen

## 2020-12-22 NOTE — ED Triage Notes (Signed)
Sinus congestion, coughing since Saturday,

## 2020-12-22 NOTE — ED Provider Notes (Signed)
[facility]   [id number] 12/22/20 Arrival Time: 1317  Cc: sinus congestion  SUBJECTIVE:  Lynn Nolan is a 30 y.o. female who presents with sinus pain/ pressure x 1 month and cough x few days.  Denies positive sick exposure or precipitating event.  Has tried OTC medications without relief.  Symptoms are made worse with at night.  Reports previous symptoms in the past.   Denies fever, chills, SOB, wheezing, chest pain, nausea, changes in bowel or bladder habits.    ROS: As per HPI.  All other pertinent ROS negative.     Past Medical History:  Diagnosis Date  . Abdominal cramping    intermittent "since BLT"  . Anxiety   . Chronic nausea   . Depression    hx of PPD  . LGA (large for gestational age) fetus    with previous pregnancy  . Previous cesarean section    x2  . Pyelonephritis   . Rh negative, maternal   . Spina bifida in child of prior pregnancy, currently pregnant    Past Surgical History:  Procedure Laterality Date  . ADENOIDECTOMY    . CESAREAN SECTION  x2  . CESAREAN SECTION WITH BILATERAL TUBAL LIGATION Bilateral 10/21/2013   Procedure: PRIMARY CESAREAN SECTION WITH BILATERAL TUBAL LIGATION;  Surgeon: Esmeralda Arthur, MD;  Location: [facility];  Service: Obstetrics;  Laterality: Bilateral;  filshie clips  . TONSILLECTOMY     Allergies  Allergen Reactions  . Amoxicillin Other (See Comments)    Childhood allergy  . Erythromycin     Childhood allergy  . Penicillins Other (See Comments)    Has patient had a PCN reaction causing immediate rash, facial/tongue/throat swelling, SOB or lightheadedness with hypotension: unknown Has patient had a PCN reaction causing severe rash involving mucus membranes or skin necrosis:unknown} Has patient had a PCN reaction that required hospitalization:  unknown Has patient had a PCN reaction occurring within the last 10 years: unknown  . Wellbutrin [Bupropion Hcl] Nausea Only and Other (See Comments)    Dizziness    No current facility-administered medications on file prior to encounter.   Current Outpatient Medications on File Prior to Encounter  Medication Sig Dispense Refill  . naproxen (NAPROSYN) 500 MG tablet Take 1 tablet (500 mg total) by mouth 2 (two) times daily. 30 tablet 0    Social History   Socioeconomic History  . Marital status: Single    Spouse name: Not on file  . Number of children: Not on file  . Years of education: Not on file  . Highest education level: Not on file  Occupational History  . Not on file  Tobacco Use  . Smoking status: Current Every Day Smoker    Packs/day: 1.00    Years: 5.00    Pack years: 5.00    Types: Cigarettes  . Smokeless tobacco: Never Used  Vaping Use  . Vaping Use: Never used  Substance and Sexual Activity  . Alcohol use: Yes    Comment: occasionally  . Drug use: No  . Sexual activity: Yes    Birth control/protection: Surgical  Other Topics Concern  . Not on file  Social History Narrative  . Not on file   Social Determinants of Health   Financial Resource Strain: Not on file  Food Insecurity: Not on file  Transportation Needs: Not on file  Physical Activity: Not on file  Stress: Not on file  Social Connections: Not on file  Intimate Partner Violence: Not on file  Family History  Problem Relation Age of Onset  . Diabetes Maternal Grandfather   . Healthy Mother      OBJECTIVE:  Vitals:   12/22/20 1327  BP: 114/79  Pulse: 82  Resp: 18  Temp: 98.3 F (36.8 C)  TempSrc: Oral  SpO2: 95%     General appearance: Alert, appears fatigued, but nontoxic; speaking in full sentences without difficulty HEENT:NCAT; Ears: EACs clear, TMs pearly gray; Eyes: PERRL.  EOM grossly intact. Nose: nares patent without rhinorrhea; Throat: tonsils nonerythematous or enlarged, uvula midline  Neck: supple without LAD Lungs: clear to auscultation bilaterally without adventitious breath sounds; normal respiratory effort; mild cough  present Heart: regular rate and rhythm.   Skin: warm and dry Psychological: alert and cooperative; normal mood and affect   ASSESSMENT & PLAN:  1. Sinus congestion   2. Acute non-recurrent pansinusitis   3. Cough     Meds ordered this encounter  Medications  . doxycycline (VIBRAMYCIN) 100 MG capsule    Sig: Take 1 capsule (100 mg total) by mouth 2 (two) times daily.    Dispense:  20 capsule    Refill:  0    Order Specific Question:   Supervising Provider    Answer:   Eustace Moore [[id number]]  . benzonatate (TESSALON) 100 MG capsule    Sig: Take 1 capsule (100 mg total) by mouth every 8 (eight) hours.    Dispense:  21 capsule    Refill:  0    Order Specific Question:   Supervising Provider    Answer:   Eustace Moore [[id number]]    No orders of the defined types were placed in this encounter.    Rest and push fluids Tessalon prescribed.  Use daily for symptomatic relief Doxycycline prescribed.  Take as directed and to completion Continue with OTC ibuprofen/tylenol as needed for pain Follow up with PCP as needed Return or go to the ED if you have any new or worsening symptoms such as fever, chills, worsening sinus pain/pressure, cough, sore throat, chest pain, shortness of breath, abdominal pain, changes in bowel or bladder habits, etc...    Reviewed expectations re: course of current medical issues. Questions answered. Outlined signs and symptoms indicating need for more acute intervention. Patient verbalized understanding. After Visit Summary given.          Darlys, Buis, PA-C 12/22/20 1346

## 2021-02-15 ENCOUNTER — Ambulatory Visit: Payer: Self-pay

## 2021-07-05 ENCOUNTER — Emergency Department (HOSPITAL_COMMUNITY)
Admission: EM | Admit: 2021-07-05 | Discharge: 2021-07-05 | Disposition: A | Discharge status: Home / Self Care | Payer: Medicaid Other

## 2021-07-05 ENCOUNTER — Other Ambulatory Visit: Payer: Self-pay

## 2021-07-21 ENCOUNTER — Ambulatory Visit: Payer: Self-pay

## 2021-11-27 ENCOUNTER — Emergency Department (HOSPITAL_COMMUNITY): Payer: Medicaid Other

## 2021-11-27 ENCOUNTER — Emergency Department (HOSPITAL_COMMUNITY)
Admission: EM | Admit: 2021-11-27 | Discharge: 2021-11-27 | Disposition: A | Discharge status: Home / Self Care | Payer: Medicaid Other | Attending: Emergency Medicine | Admitting: Emergency Medicine

## 2021-11-27 ENCOUNTER — Other Ambulatory Visit: Payer: Self-pay

## 2021-11-27 ENCOUNTER — Encounter (HOSPITAL_COMMUNITY): Payer: Self-pay

## 2021-11-27 DIAGNOSIS — Z79891 Long term (current) use of opiate analgesic: Secondary | ICD-10-CM | POA: Diagnosis not present

## 2021-11-27 DIAGNOSIS — S61411A Laceration without foreign body of right hand, initial encounter: Secondary | ICD-10-CM

## 2021-11-27 DIAGNOSIS — R0689 Other abnormalities of breathing: Secondary | ICD-10-CM | POA: Diagnosis not present

## 2021-11-27 DIAGNOSIS — S5012XA Contusion of left forearm, initial encounter: Secondary | ICD-10-CM | POA: Diagnosis not present

## 2021-11-27 DIAGNOSIS — Z0471 Encounter for examination and observation following alleged adult physical abuse: Secondary | ICD-10-CM | POA: Diagnosis not present

## 2021-11-27 DIAGNOSIS — T7411XA Adult physical abuse, confirmed, initial encounter: Secondary | ICD-10-CM | POA: Diagnosis present

## 2021-11-27 DIAGNOSIS — R519 Headache, unspecified: Secondary | ICD-10-CM | POA: Diagnosis not present

## 2021-11-27 DIAGNOSIS — T7491XA Unspecified adult maltreatment, confirmed, initial encounter: Secondary | ICD-10-CM

## 2021-11-27 DIAGNOSIS — S5011XA Contusion of right forearm, initial encounter: Secondary | ICD-10-CM | POA: Diagnosis not present

## 2021-11-27 DIAGNOSIS — T07XXXA Unspecified multiple injuries, initial encounter: Secondary | ICD-10-CM

## 2021-11-27 DIAGNOSIS — R457 State of emotional shock and stress, unspecified: Secondary | ICD-10-CM | POA: Diagnosis not present

## 2021-11-27 DIAGNOSIS — R52 Pain, unspecified: Secondary | ICD-10-CM | POA: Diagnosis not present

## 2021-11-27 DIAGNOSIS — S199XXA Unspecified injury of neck, initial encounter: Secondary | ICD-10-CM | POA: Diagnosis not present

## 2021-11-27 DIAGNOSIS — R000 Tachycardia, unspecified: Secondary | ICD-10-CM | POA: Diagnosis not present

## 2021-11-27 DIAGNOSIS — S60221A Contusion of right hand, initial encounter: Secondary | ICD-10-CM | POA: Diagnosis not present

## 2021-11-27 LAB — CBC WITH DIFFERENTIAL/PLATELET
Abs Immature Granulocytes: 0.08 10*3/uL — ABNORMAL HIGH (ref 0.00–0.07)
Basophils Absolute: 0.1 10*3/uL (ref 0.0–0.1)
Basophils Relative: 0 %
Eosinophils Absolute: 0.6 10*3/uL — ABNORMAL HIGH (ref 0.0–0.5)
Eosinophils Relative: 3 %
HCT: 44.8 % (ref 36.0–46.0)
Hemoglobin: 15.6 g/dL — ABNORMAL HIGH (ref 12.0–15.0)
Immature Granulocytes: 1 %
Lymphocytes Relative: 10 %
Lymphs Abs: 1.7 10*3/uL (ref 0.7–4.0)
MCH: 33.6 pg (ref 26.0–34.0)
MCHC: 34.8 g/dL (ref 30.0–36.0)
MCV: 96.6 fL (ref 80.0–100.0)
Monocytes Absolute: 1 10*3/uL (ref 0.1–1.0)
Monocytes Relative: 6 %
Neutro Abs: 13.4 10*3/uL — ABNORMAL HIGH (ref 1.7–7.7)
Neutrophils Relative %: 80 %
Platelets: 210 10*3/uL (ref 150–400)
RBC: 4.64 MIL/uL (ref 3.87–5.11)
RDW: 12.2 % (ref 11.5–15.5)
WBC: 16.8 10*3/uL — ABNORMAL HIGH (ref 4.0–10.5)
nRBC: 0 % (ref 0.0–0.2)

## 2021-11-27 LAB — COMPREHENSIVE METABOLIC PANEL
ALT: 15 U/L (ref 0–44)
AST: 16 U/L (ref 15–41)
Albumin: 4.4 g/dL (ref 3.5–5.0)
Alkaline Phosphatase: 65 U/L (ref 38–126)
Anion gap: 12 (ref 5–15)
BUN: 12 mg/dL (ref 6–20)
CO2: 21 mmol/L — ABNORMAL LOW (ref 22–32)
Calcium: 9 mg/dL (ref 8.9–10.3)
Chloride: 103 mmol/L (ref 98–111)
Creatinine, Ser: 0.79 mg/dL (ref 0.44–1.00)
GFR, Estimated: >60 mL/min (ref >60)
Glucose, Bld: 106 mg/dL — ABNORMAL HIGH (ref 70–99)
Potassium: 3.4 mmol/L — ABNORMAL LOW (ref 3.5–5.1)
Sodium: 136 mmol/L (ref 135–145)
Total Bilirubin: 1.5 mg/dL — ABNORMAL HIGH (ref 0.3–1.2)
Total Protein: 8 g/dL (ref 6.5–8.1)

## 2021-11-27 LAB — HCG, QUANTITATIVE, PREGNANCY: hCG, Beta Chain, Quant, S: <1 m[IU]/mL (ref <5)

## 2021-11-27 MED ORDER — SODIUM CHLORIDE 0.9 % IV BOLUS
1000.0000 mL | Freq: Once | INTRAVENOUS | Status: AC
Start: 2021-11-27 — End: 2021-11-27
  Administered 2021-11-27: 1000 mL via INTRAVENOUS

## 2021-11-27 MED ORDER — LIDOCAINE HCL (PF) 2 % IJ SOLN
INTRAMUSCULAR | Status: AC
  Filled 2021-11-27: qty 10

## 2021-11-27 MED ORDER — IBUPROFEN 600 MG PO TABS: 600.0000 mg | ORAL_TABLET | Freq: Three times a day (TID) | ORAL | 0 refills | Status: AC

## 2021-11-27 MED ORDER — POVIDONE-IODINE 10 % EX SOLN
CUTANEOUS | Status: DC | PRN
  Filled 2021-11-27: qty 14.8

## 2021-11-27 MED ORDER — ACETAMINOPHEN 500 MG PO TABS
1000.0000 mg | ORAL_TABLET | Freq: Once | ORAL | Status: AC
  Administered 2021-11-27: 1000 mg via ORAL
  Filled 2021-11-27: qty 2

## 2021-11-27 MED ORDER — HYDROCODONE-ACETAMINOPHEN 5-325 MG PO TABS: 1.0000 | ORAL_TABLET | Freq: Four times a day (QID) | ORAL | 0 refills | Status: AC | PRN

## 2021-11-27 MED ORDER — LIDOCAINE HCL (PF) 2 % IJ SOLN: 5.0000 mL | Freq: Once | INTRAMUSCULAR | Status: DC

## 2021-11-27 MED ORDER — IOHEXOL 350 MG/ML SOLN
75.0000 mL | Freq: Once | INTRAVENOUS | Status: AC | PRN
  Administered 2021-11-27: 75 mL via INTRAVENOUS
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
## 2021-11-27 NOTE — ED Notes (Signed)
Patient transported to CT 
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
## 2021-11-29 ENCOUNTER — Telehealth: Payer: Self-pay

## 2021-11-29 NOTE — Telephone Encounter (Signed)
Transition Care Management Unsuccessful Follow-up Telephone Call ? ?Date of discharge and from where:  11/27/2021-[place]  ? ?Attempts:  1st Attempt ? ?Reason for unsuccessful TCM follow-up call:  Unable to reach patient ? ?  ?

## 2021-11-30 NOTE — Telephone Encounter (Signed)
Transition Care Management Unsuccessful Follow-up Telephone Call ? ?Date of discharge and from where:  11/27/2021-[place]  ? ?Attempts:  2nd Attempt ? ?Reason for unsuccessful TCM follow-up call:  Unable to reach patient ? ?  ?

## 2021-12-01 NOTE — Telephone Encounter (Signed)
Transition Care Management Unsuccessful Follow-up Telephone Call ? ?Date of discharge and from where:  11/27/2021-[place]  ? ?Attempts:  3rd Attempt ? ?Reason for unsuccessful TCM follow-up call:  Unable to reach patient ? ?  ?

## 2021-12-07 ENCOUNTER — Ambulatory Visit: Payer: Self-pay

## 2021-12-09 ENCOUNTER — Ambulatory Visit: Payer: Self-pay

## 2022-02-19 ENCOUNTER — Encounter: Payer: Self-pay | Admitting: Emergency Medicine

## 2022-02-19 ENCOUNTER — Ambulatory Visit
Admission: EM | Admit: 2022-02-19 | Discharge: 2022-02-19 | Disposition: A | Discharge status: Home / Self Care | Payer: Medicaid Other | Attending: Nurse Practitioner | Admitting: Nurse Practitioner

## 2022-02-19 DIAGNOSIS — J0140 Acute pansinusitis, unspecified: Secondary | ICD-10-CM | POA: Diagnosis not present

## 2022-02-19 MED ORDER — FLUTICASONE PROPIONATE 50 MCG/ACT NA SUSP: 2.0000 | Freq: Every day | NASAL | 0 refills | Status: DC

## 2022-02-19 MED ORDER — DOXYCYCLINE HYCLATE 100 MG PO TABS: 100.0000 mg | ORAL_TABLET | Freq: Two times a day (BID) | ORAL | 0 refills | Status: AC

## 2022-02-19 MED ORDER — PSEUDOEPH-BROMPHEN-DM 30-2-10 MG/5ML PO SYRP: 5.0000 mL | ORAL_SOLUTION | Freq: Four times a day (QID) | ORAL | 0 refills | Status: AC | PRN

## 2022-02-19 NOTE — ED Provider Notes (Signed)
[facility]    CSN: [id number] Arrival date & time: 02/19/22  1302      History   Chief Complaint No chief complaint on file.   HPI Lynn Nolan is a 31 y.o. female.   HPI  Patient presents for upper respiratory symptoms that been present for the past month.  She states over the past week she has had increased sinus pressure, nasal congestion and cough.  Cough is productive.  She also complains of headache and bilateral ear fullness.  Sinus pressure is on the right side of her face.  She has been taking over-the-counter medications without relief.  She denies fever, chills, wheezing, shortness of breath, or GI symptoms.  Past Medical History:  Diagnosis Date   Abdominal cramping    intermittent "since BLT"   Anxiety    Chronic nausea    Depression    hx of PPD   LGA (large for gestational age) fetus    with previous pregnancy   Previous cesarean section    x2   Pyelonephritis    Rh negative, maternal    Spina bifida in child of prior pregnancy, currently pregnant     Patient Active Problem List   Diagnosis Date Noted   Chronic nausea 10/03/2017   History of pyelonephritis 09/06/2013   History of postpartum depression 09/06/2013    Past Surgical History:  Procedure Laterality Date   ADENOIDECTOMY     CESAREAN SECTION  x2   CESAREAN SECTION WITH BILATERAL TUBAL LIGATION Bilateral 10/21/2013   Procedure: PRIMARY CESAREAN SECTION WITH BILATERAL TUBAL LIGATION;  Surgeon: Esmeralda ArthurSandra A Rivard, MD;  Location: [facility];  Service: Obstetrics;  Laterality: Bilateral;  filshie clips   TONSILLECTOMY      OB History     Gravida  3   Para  3   Term  2   Preterm  1   AB      Living  4      SAB      IAB      Ectopic      Multiple  1   Live Births  1            Home Medications    Prior to Admission medications   Medication Sig Start Date End Date Taking? Authorizing Provider  brompheniramine-pseudoephedrine-DM 30-2-10 MG/5ML syrup  Take 5 mLs by mouth 4 (four) times daily as needed. 02/19/22  Yes Nyala Kirchner-Warren, Sadie Haberhristie J, NP  doxycycline (VIBRA-TABS) 100 MG tablet Take 1 tablet (100 mg total) by mouth 2 (two) times daily. 02/19/22  Yes Clorissa Gruenberg-Warren, Sadie Haberhristie J, NP  fluticasone (FLONASE) 50 MCG/ACT nasal spray Place 2 sprays into both nostrils daily. 02/19/22  Yes Shanavia Makela-Warren, Sadie Haberhristie J, NP  benzonatate (TESSALON) 100 MG capsule Take 1 capsule (100 mg total) by mouth every 8 (eight) hours. 12/22/20   Wurst, [name], PA-C  HYDROcodone-acetaminophen (NORCO/VICODIN) 5-325 MG tablet Take 1 tablet by mouth every 6 (six) hours as needed for severe pain. 11/27/21   Burgess AmorIdol, Julie, PA-C  ibuprofen (ADVIL) 600 MG tablet Take 1 tablet (600 mg total) by mouth 3 (three) times daily. 11/27/21   Burgess AmorIdol, Julie, PA-C    Family History Family History  Problem Relation Age of Onset   Diabetes Maternal Grandfather    Healthy Mother     Social History Social History   Tobacco Use   Smoking status: Every Day    Packs/day: 1.00    Years: 5.00    Total pack years: 5.00  Types: Cigarettes   Smokeless tobacco: Never  Vaping Use   Vaping Use: Never used  Substance Use Topics   Alcohol use: Yes    Alcohol/week: 3.0 standard drinks of alcohol    Types: 3 Cans of beer per week    Comment: weekly   Drug use: Yes    Types: Marijuana    Comment: occassionally     Allergies   Amoxicillin, Erythromycin, Penicillins, and Wellbutrin [bupropion hcl]   Review of Systems Review of Systems Per HPI  Physical Exam Triage Vital Signs ED Triage Vitals  Enc Vitals Group     BP 02/19/22 1438 109/77     Pulse Rate 02/19/22 1438 87     Resp 02/19/22 1438 18     Temp 02/19/22 1438 98.6 F (37 C)     Temp Source 02/19/22 1438 Oral     SpO2 02/19/22 1438 98 %     Weight --      Height --      Head Circumference --      Peak Flow --      Pain Score 02/19/22 1439 7     Pain Loc --      Pain Edu? --      Excl. in GC? --    No data  found.  Updated Vital Signs BP 109/77 (BP Location: Right Arm)   Pulse 87   Temp 98.6 F (37 C) (Oral)   Resp 18   LMP 12/23/2021 (Exact Date)   SpO2 98%   Visual Acuity Right Eye Distance:   Left Eye Distance:   Bilateral Distance:    Right Eye Near:   Left Eye Near:    Bilateral Near:     Physical Exam Constitutional:      Appearance: Normal appearance. She is well-developed.  HENT:     Head: Normocephalic and atraumatic.     Right Ear: Tympanic membrane, ear canal and external ear normal.     Left Ear: Tympanic membrane, ear canal and external ear normal.     Nose: Congestion present.     Right Turbinates: Enlarged and swollen.     Left Turbinates: Enlarged and swollen.     Right Sinus: Maxillary sinus tenderness and frontal sinus tenderness present.     Left Sinus: No maxillary sinus tenderness or frontal sinus tenderness.     Mouth/Throat:     Mouth: Mucous membranes are moist.  Eyes:     Conjunctiva/sclera: Conjunctivae normal.     Pupils: Pupils are equal, round, and reactive to light.  Neck:     Thyroid: No thyromegaly.     Trachea: No tracheal deviation.  Cardiovascular:     Rate and Rhythm: Normal rate and regular rhythm.     Heart sounds: Normal heart sounds.  Pulmonary:     Effort: Pulmonary effort is normal.     Breath sounds: Normal breath sounds.  Abdominal:     General: Bowel sounds are normal. There is no distension.     Palpations: Abdomen is soft.     Tenderness: There is no abdominal tenderness.  Musculoskeletal:     Cervical back: Normal range of motion and neck supple.  Skin:    General: Skin is warm and dry.  Neurological:     Mental Status: She is alert and oriented to person, place, and time.  Psychiatric:        Behavior: Behavior normal.        Thought Content: Thought content normal.  Judgment: Judgment normal.      UC Treatments / Results  Labs (all labs ordered are listed, but only abnormal results are  displayed) Labs Reviewed - No data to display  EKG   Radiology No results found.  Procedures Procedures (including critical care time)  Medications Ordered in UC Medications - No data to display  Initial Impression / Assessment and Plan / UC Course  I have reviewed the triage vital signs and the nursing notes.  Pertinent labs & imaging results that were available during my care of the patient were reviewed by me and considered in my medical decision making (see chart for details).  Patient presents with upper respiratory symptoms that been present for the past month, with rapid worsening over the past week.  Given the duration of her symptoms, will treat patient with doxycycline, Bromfed, and fluticasone.  Supportive care recommendations were also provided to the patient.  Patient advised to follow-up if symptoms worsen or do not improve. Final Clinical Impressions(s) / UC Diagnoses   Final diagnoses:  Acute pansinusitis, recurrence not specified     Discharge Instructions      Take medication as directed. Increase fluids and get plenty of rest. May take over-the-counter ibuprofen or Tylenol as needed for pain, fever, or general discomfort. Recommend normal saline nasal spray to help with nasal congestion throughout the day. For your cough, it may be helpful to use a humidifier at bedtime during sleep. If your symptoms fail to improve within the next 7 to 10 days, please follow-up in our clinic.      ED Prescriptions     Medication Sig Dispense Auth. Provider   doxycycline (VIBRA-TABS) 100 MG tablet Take 1 tablet (100 mg total) by mouth 2 (two) times daily. 20 tablet Betty Daidone-Warren, Sadie Haber, NP   brompheniramine-pseudoephedrine-DM 30-2-10 MG/5ML syrup Take 5 mLs by mouth 4 (four) times daily as needed. 140 mL Virjean Boman-Warren, Sadie Haber, NP   fluticasone (FLONASE) 50 MCG/ACT nasal spray Place 2 sprays into both nostrils daily. 16 g Janari Yamada-Warren, Sadie Haber, NP       PDMP not reviewed this encounter.   Abran Cantor, NP 02/19/22 1517

## 2022-02-19 NOTE — Discharge Instructions (Addendum)
Take medication as directed. Increase fluids and get plenty of rest. May take over-the-counter ibuprofen or Tylenol as needed for pain, fever, or general discomfort. Recommend normal saline nasal spray to help with nasal congestion throughout the day. For your cough, it may be helpful to use a humidifier at bedtime during sleep. If your symptoms fail to improve within the next 7 to 10 days, please follow-up in our clinic.  

## 2022-02-19 NOTE — ED Triage Notes (Signed)
Sinus pressure, nasal congestion x 1 week.  Over the counter medications have not been helping.

## 2022-04-19 ENCOUNTER — Emergency Department (HOSPITAL_COMMUNITY)
Admission: EM | Admit: 2022-04-19 | Discharge: 2022-04-20 | Discharge status: Against Medical Advice | Payer: Medicaid Other | Attending: Emergency Medicine | Admitting: Emergency Medicine

## 2022-04-19 DIAGNOSIS — Z5321 Procedure and treatment not carried out due to patient leaving prior to being seen by health care provider: Secondary | ICD-10-CM | POA: Insufficient documentation

## 2022-04-19 DIAGNOSIS — R079 Chest pain, unspecified: Secondary | ICD-10-CM | POA: Insufficient documentation

## 2022-04-19 DIAGNOSIS — R42 Dizziness and giddiness: Secondary | ICD-10-CM | POA: Insufficient documentation

## 2022-04-19 DIAGNOSIS — F419 Anxiety disorder, unspecified: Secondary | ICD-10-CM | POA: Diagnosis not present

## 2022-04-19 DIAGNOSIS — R112 Nausea with vomiting, unspecified: Secondary | ICD-10-CM | POA: Diagnosis not present

## 2022-04-19 LAB — I-STAT BETA HCG BLOOD, ED (MC, WL, AP ONLY): I-stat hCG, quantitative: <5 m[IU]/mL (ref <5)

## 2022-04-19 LAB — COMPREHENSIVE METABOLIC PANEL
ALT: 17 U/L (ref 0–44)
AST: 19 U/L (ref 15–41)
Albumin: 4.1 g/dL (ref 3.5–5.0)
Alkaline Phosphatase: 61 U/L (ref 38–126)
Anion gap: 10 (ref 5–15)
BUN: 9 mg/dL (ref 6–20)
CO2: 22 mmol/L (ref 22–32)
Calcium: 9.5 mg/dL (ref 8.9–10.3)
Chloride: 104 mmol/L (ref 98–111)
Creatinine, Ser: 0.71 mg/dL (ref 0.44–1.00)
GFR, Estimated: >60 mL/min (ref >60)
Glucose, Bld: 138 mg/dL — ABNORMAL HIGH (ref 70–99)
Potassium: 3.3 mmol/L — ABNORMAL LOW (ref 3.5–5.1)
Sodium: 136 mmol/L (ref 135–145)
Total Bilirubin: 0.6 mg/dL (ref 0.3–1.2)
Total Protein: 7.6 g/dL (ref 6.5–8.1)

## 2022-04-19 LAB — CBC
HCT: 44 % (ref 36.0–46.0)
Hemoglobin: 15.9 g/dL — ABNORMAL HIGH (ref 12.0–15.0)
MCH: 33.5 pg (ref 26.0–34.0)
MCHC: 36.1 g/dL — ABNORMAL HIGH (ref 30.0–36.0)
MCV: 92.6 fL (ref 80.0–100.0)
Platelets: 226 10*3/uL (ref 150–400)
RBC: 4.75 MIL/uL (ref 3.87–5.11)
RDW: 11.8 % (ref 11.5–15.5)
WBC: 12.6 10*3/uL — ABNORMAL HIGH (ref 4.0–10.5)
nRBC: 0 % (ref 0.0–0.2)

## 2022-04-19 LAB — LIPASE, BLOOD: Lipase: 26 U/L (ref 11–51)

## 2022-04-19 MED ORDER — ONDANSETRON 4 MG PO TBDP
4.0000 mg | ORAL_TABLET | Freq: Once | ORAL | Status: AC | PRN
  Administered 2022-04-19: 4 mg via ORAL

## 2022-04-19 NOTE — ED Triage Notes (Signed)
Pt c/o dizziness, nausea, emesis "x3-4 times back to back" approx 3hrs ago. Took a nap after, anxiety/panic on waking, chest/ L arm "pain down to middle finger." States "it's been like a week" since last marijuana use. Irregular periods, states tubes are clamped.

## 2022-04-20 NOTE — ED Notes (Signed)
X2 no response °

## 2022-07-06 ENCOUNTER — Telehealth: Payer: Medicaid Other | Admitting: Nurse Practitioner

## 2022-07-06 DIAGNOSIS — B3731 Acute candidiasis of vulva and vagina: Secondary | ICD-10-CM

## 2022-07-06 MED ORDER — FLUCONAZOLE 150 MG PO TABS: 150.0000 mg | ORAL_TABLET | Freq: Once | ORAL | 0 refills | Status: AC

## 2022-07-06 NOTE — Progress Notes (Signed)
Virtual Visit Consent   Lynn Nolan, you are scheduled for a virtual visit with [name], FNP, a [facility] provider, today.     Just as with appointments in the office, your consent must be obtained to participate.  Your consent will be active for this visit and any virtual visit you may have with one of our providers in the next 365 days.     If you have a MyChart account, a copy of this consent can be sent to you electronically.  All virtual visits are billed to your insurance company just like a traditional visit in the office.    As this is a virtual visit, video technology does not allow for your provider to perform a traditional examination.  This may limit your provider's ability to fully assess your condition.  If your provider identifies any concerns that need to be evaluated in person or the need to arrange testing (such as labs, EKG, etc.), we will make arrangements to do so.     Although advances in technology are sophisticated, we cannot ensure that it will always work on either your end or our end.  If the connection with a video visit is poor, the visit may have to be switched to a telephone visit.  With either a video or telephone visit, we are not always able to ensure that we have a secure connection.     I need to obtain your verbal consent now.   Are you willing to proceed with your visit today? YES   Lynn D Turbin has provided verbal consent on 07/06/2022 for a virtual visit (video or telephone).   [name], FNP   Date: 07/06/2022 9:31 AM   Virtual Visit via Video Note   I, [name], connected with Lynn Nolan ([id number], [birth year]) on 07/06/22 at  9:30 AM EDT by a video-enabled telemedicine application and verified that I am speaking with the correct person using two identifiers.  Location: Patient: Virtual Visit Location Patient: Home Provider: Virtual Visit Location Provider: Mobile   I discussed  the limitations of evaluation and management by telemedicine and the availability of in person appointments. The patient expressed understanding and agreed to proceed.    History of Present Illness: Lynn Nolan is a 31 y.o. who identifies as a female who was assigned female at birth, and is being seen today for vaginal discharge.  HPI: Patient has vaginal discharge.   Vaginal Discharge The patient's primary symptoms include genital itching and vaginal discharge. This is a new problem. The current episode started in the past 7 days. The problem occurs constantly. The problem has been gradually worsening. The patient is experiencing no pain. The problem affects both sides. She is not pregnant. Pertinent negatives include no constipation, diarrhea, flank pain, painful intercourse or rash. The vaginal discharge was white and copious. There has been no bleeding. She has not been passing clots. She has not been passing tissue. Nothing aggravates the symptoms. She has tried nothing for the symptoms. She is sexually active. No, her partner does not have an STD. Her menstrual history has been regular.    Review of Systems  Gastrointestinal:  Negative for constipation and diarrhea.  Genitourinary:  Positive for vaginal discharge. Negative for flank pain.  Skin:  Negative for rash.    Problems:  Patient Active Problem List   Diagnosis Date Noted   Chronic nausea 10/03/2017   History of pyelonephritis 09/06/2013   History of postpartum depression 09/06/2013  Allergies:  Allergies  Allergen Reactions   Amoxicillin Other (See Comments)    Childhood allergy   Erythromycin     Childhood allergy   Penicillins Other (See Comments)    Has patient had a PCN reaction causing immediate rash, facial/tongue/throat swelling, SOB or lightheadedness with hypotension: unknown Has patient had a PCN reaction causing severe rash involving mucus membranes or skin necrosis:unknown} Has patient had a PCN  reaction that required hospitalization:  unknown Has patient had a PCN reaction occurring within the last 10 years: unknown   Wellbutrin [Bupropion Hcl] Nausea Only and Other (See Comments)    Dizziness   Medications:  Current Outpatient Medications:    benzonatate (TESSALON) 100 MG capsule, Take 1 capsule (100 mg total) by mouth every 8 (eight) hours., Disp: 21 capsule, Rfl: 0   brompheniramine-pseudoephedrine-DM 30-2-10 MG/5ML syrup, Take 5 mLs by mouth 4 (four) times daily as needed., Disp: 140 mL, Rfl: 0   doxycycline (VIBRA-TABS) 100 MG tablet, Take 1 tablet (100 mg total) by mouth 2 (two) times daily., Disp: 20 tablet, Rfl: 0   fluticasone (FLONASE) 50 MCG/ACT nasal spray, Place 2 sprays into both nostrils daily., Disp: 16 g, Rfl: 0   HYDROcodone-acetaminophen (NORCO/VICODIN) 5-325 MG tablet, Take 1 tablet by mouth every 6 (six) hours as needed for severe pain., Disp: 12 tablet, Rfl: 0   ibuprofen (ADVIL) 600 MG tablet, Take 1 tablet (600 mg total) by mouth 3 (three) times daily., Disp: 15 tablet, Rfl: 0  Observations/Objective: Patient is well-developed, well-nourished in no acute distress.  Resting comfortably  at home.  Head is normocephalic, atraumatic.  No labored breathing.  Speech is clear and coherent with logical content.  Patient is alert and oriented at baseline.    Assessment and Plan:  [name] in today with chief complaint of Vaginal Discharge   1. Vaginal candidiasis No douching avoid after intercourse Avoid bubble baths  Meds ordered this encounter  Medications   fluconazole (DIFLUCAN) 150 MG tablet    Sig: Take 1 tablet (150 mg total) by mouth once for 1 dose.    Dispense:  1 tablet    Refill:  0    Order Specific Question:   Supervising Provider    Answer:   Merrilee Jansky [id number]      Follow Up Instructions: I discussed the assessment and treatment plan with the patient. The patient was provided an opportunity to ask questions  and all were answered. The patient agreed with the plan and demonstrated an understanding of the instructions.  A copy of instructions were sent to the patient via MyChart.  The patient was advised to call back or seek an in-person evaluation if the symptoms worsen or if the condition fails to improve as anticipated.  Time:  I spent 13 minutes with the patient via telehealth technology discussing the above problems/concerns.    Mary-Margaret Daphine Deutscher, FNP

## 2022-07-26 ENCOUNTER — Ambulatory Visit
Admission: EM | Admit: 2022-07-26 | Discharge: 2022-07-26 | Disposition: A | Discharge status: Home / Self Care | Payer: Medicaid Other | Attending: Internal Medicine | Admitting: Internal Medicine

## 2022-07-26 DIAGNOSIS — A084 Viral intestinal infection, unspecified: Secondary | ICD-10-CM | POA: Diagnosis not present

## 2022-07-26 DIAGNOSIS — R197 Diarrhea, unspecified: Secondary | ICD-10-CM | POA: Diagnosis not present

## 2022-07-26 DIAGNOSIS — R112 Nausea with vomiting, unspecified: Secondary | ICD-10-CM

## 2022-07-26 MED ORDER — ONDANSETRON 4 MG PO TBDP: 4.0000 mg | ORAL_TABLET | Freq: Three times a day (TID) | ORAL | 0 refills | Status: AC | PRN

## 2022-07-26 NOTE — ED Triage Notes (Addendum)
Pt presents to uc with co of nausea, vomiting and stomach pain since Thursday. Pt reports difficulty keeping food down and pain is 6/10 pain is comes and goes but nausea is constant. Pt reports attempting peto with no improvement

## 2022-07-26 NOTE — ED Provider Notes (Signed)
[facility]    CSN: [id number] Arrival date & time: 07/26/22  1839      History   Chief Complaint Chief Complaint  Patient presents with   Abdominal Pain    Nausea, vomiting    HPI Lynn Nolan is a 31 y.o. female.   Patient presents with 5-day history of nausea, vomiting, diarrhea.  Denies blood in stool or emesis.  Patient denies abdominal pain but does report that she has had some "bloating".  Denies any associated fever, body aches, chills, upper respiratory symptoms, cough.  Denies any known sick contacts, recent unfavorable foods, travel outside [place].  Patient reports that she been having difficulty keeping food and fluids down.   Abdominal Pain   Past Medical History:  Diagnosis Date   Abdominal cramping    intermittent "since BLT"   Anxiety    Chronic nausea    Depression    hx of PPD   LGA (large for gestational age) fetus    with previous pregnancy   Previous cesarean section    x2   Pyelonephritis    Rh negative, maternal    Spina bifida in child of prior pregnancy, currently pregnant     Patient Active Problem List   Diagnosis Date Noted   Chronic nausea 10/03/2017   History of pyelonephritis 09/06/2013   History of postpartum depression 09/06/2013    Past Surgical History:  Procedure Laterality Date   ADENOIDECTOMY     CESAREAN SECTION  x2   CESAREAN SECTION WITH BILATERAL TUBAL LIGATION Bilateral 10/21/2013   Procedure: PRIMARY CESAREAN SECTION WITH BILATERAL TUBAL LIGATION;  Surgeon: Esmeralda Arthur, MD;  Location: [facility];  Service: Obstetrics;  Laterality: Bilateral;  filshie clips   TONSILLECTOMY      OB History     Gravida  3   Para  3   Term  2   Preterm  1   AB      Living  4      SAB      IAB      Ectopic      Multiple  1   Live Births  1            Home Medications    Prior to Admission medications   Medication Sig Start Date End Date Taking? Authorizing Provider   ondansetron (ZOFRAN-ODT) 4 MG disintegrating tablet Take 1 tablet (4 mg total) by mouth every 8 (eight) hours as needed for nausea or vomiting. 07/26/22  Yes Gwendy Boeder, Rolly Salter E, FNP  benzonatate (TESSALON) 100 MG capsule Take 1 capsule (100 mg total) by mouth every 8 (eight) hours. 12/22/20   Wurst, [place], PA-C  brompheniramine-pseudoephedrine-DM 30-2-10 MG/5ML syrup Take 5 mLs by mouth 4 (four) times daily as needed. 02/19/22   Leath-Warren, Sadie Haber, NP  doxycycline (VIBRA-TABS) 100 MG tablet Take 1 tablet (100 mg total) by mouth 2 (two) times daily. 02/19/22   Leath-Warren, Sadie Haber, NP  fluticasone (FLONASE) 50 MCG/ACT nasal spray Place 2 sprays into both nostrils daily. 02/19/22   Leath-Warren, Sadie Haber, NP  HYDROcodone-acetaminophen (NORCO/VICODIN) 5-325 MG tablet Take 1 tablet by mouth every 6 (six) hours as needed for severe pain. 11/27/21   Burgess Amor, PA-C  ibuprofen (ADVIL) 600 MG tablet Take 1 tablet (600 mg total) by mouth 3 (three) times daily. 11/27/21   Burgess Amor, PA-C    Family History Family History  Problem Relation Age of Onset   Diabetes Maternal Grandfather  Healthy Mother     Social History Social History   Tobacco Use   Smoking status: Every Day    Packs/day: 1.00    Years: 5.00    Total pack years: 5.00    Types: Cigarettes   Smokeless tobacco: Never  Vaping Use   Vaping Use: Never used  Substance Use Topics   Alcohol use: Yes    Alcohol/week: 3.0 standard drinks of alcohol    Types: 3 Cans of beer per week    Comment: weekly   Drug use: Yes    Types: Marijuana    Comment: occassionally     Allergies   Amoxicillin, Erythromycin, Penicillins, and Wellbutrin [bupropion hcl]   Review of Systems Review of Systems Per HPI  Physical Exam Triage Vital Signs ED Triage Vitals [07/26/22 1904]  Enc Vitals Group     BP 101/69     Pulse Rate 87     Resp 18     Temp 98.2 F (36.8 C)     Temp Source Oral     SpO2 98 %     Weight      Height       Head Circumference      Peak Flow      Pain Score 6     Pain Loc      Pain Edu?      Excl. in GC?    No data found.  Updated Vital Signs BP 101/69   Pulse 87   Temp 98.2 F (36.8 C) (Oral)   Resp 18   SpO2 98%   Visual Acuity Right Eye Distance:   Left Eye Distance:   Bilateral Distance:    Right Eye Near:   Left Eye Near:    Bilateral Near:     Physical Exam Constitutional:      General: She is not in acute distress.    Appearance: Normal appearance. She is not toxic-appearing or diaphoretic.  HENT:     Head: Normocephalic and atraumatic.     Mouth/Throat:     Mouth: Mucous membranes are moist.     Pharynx: No posterior oropharyngeal erythema.  Eyes:     Extraocular Movements: Extraocular movements intact.     Conjunctiva/sclera: Conjunctivae normal.  Cardiovascular:     Rate and Rhythm: Normal rate and regular rhythm.     Pulses: Normal pulses.     Heart sounds: Normal heart sounds.  Pulmonary:     Effort: Pulmonary effort is normal. No respiratory distress.     Breath sounds: Normal breath sounds.  Abdominal:     General: Bowel sounds are normal. There is no distension.     Palpations: Abdomen is soft.     Tenderness: There is no abdominal tenderness.  Neurological:     General: No focal deficit present.     Mental Status: She is alert and oriented to person, place, and time. Mental status is at baseline.  Psychiatric:        Mood and Affect: Mood normal.        Behavior: Behavior normal.        Thought Content: Thought content normal.        Judgment: Judgment normal.      UC Treatments / Results  Labs (all labs ordered are listed, but only abnormal results are displayed) Labs Reviewed - No data to display  EKG   Radiology No results found.  Procedures Procedures (including critical care time)  Medications Ordered in UC Medications -  No data to display  Initial Impression / Assessment and Plan / UC Course  I have reviewed the  triage vital signs and the nursing notes.  Pertinent labs & imaging results that were available during my care of the patient were reviewed by me and considered in my medical decision making (see chart for details).     Symptoms appear viral in etiology.  Will treat with ondansetron.  Patient advised to ensure adequate fluid hydration.  No current signs of dehydration on exam.  Discussed bland diet for diarrhea as well.  Advised patient to follow-up if symptoms persist or worsen at the emergency department.  Do not think emergent evaluation is necessary at this time and there are no signs of acute abdomen.  Patient verbalized understanding and was agreeable with plan. Final Clinical Impressions(s) / UC Diagnoses   Final diagnoses:  Viral gastroenteritis  Nausea vomiting and diarrhea     Discharge Instructions      It appears that you have a viral stomach virus which should run its course and self resolve with symptomatic treatment. I have prescribed you nausea medication to take as needed.  Please ensure adequate fluid hydration with clear oral fluids that include Gatorade, water, Pedialyte.  Follow-up in the ER if symptoms persist or worsen.    ED Prescriptions     Medication Sig Dispense Auth. Provider   ondansetron (ZOFRAN-ODT) 4 MG disintegrating tablet Take 1 tablet (4 mg total) by mouth every 8 (eight) hours as needed for nausea or vomiting. 20 tablet [place], Acie Fredrickson, [place]      PDMP not reviewed this encounter.   Gustavus Bryant, [place] 07/26/22 [id number]

## 2022-07-26 NOTE — Discharge Instructions (Addendum)
It appears that you have a viral stomach virus which should run its course and self resolve with symptomatic treatment. I have prescribed you nausea medication to take as needed.  Please ensure adequate fluid hydration with clear oral fluids that include Gatorade, water, Pedialyte.  Follow-up in the ER if symptoms persist or worsen.

## 2022-12-15 ENCOUNTER — Telehealth: Payer: Medicaid Other | Admitting: Urgent Care

## 2022-12-15 NOTE — Patient Instructions (Signed)
The patient no-showed for appointment despite this provider sending direct link, reaching out via phone with no response and waiting for at least 10 minutes from appointment time for patient to join. They will be marked as a NS for this appointment/time.   Blayne Frankie L Cecilee Rosner, PA    

## 2022-12-15 NOTE — Progress Notes (Signed)
The patient no-showed for appointment despite this provider sending direct link, reaching out via phone with no response and waiting for at least 10 minutes from appointment time for patient to join. They will be marked as a NS for this appointment/time.   Lucia Harm L Shigeo Baugh, PA    

## 2023-02-08 ENCOUNTER — Ambulatory Visit
Admission: EM | Admit: 2023-02-08 | Discharge: 2023-02-08 | Disposition: A | Discharge status: Home / Self Care | Payer: Medicaid Other | Attending: Nurse Practitioner | Admitting: Nurse Practitioner

## 2023-02-08 DIAGNOSIS — K0889 Other specified disorders of teeth and supporting structures: Secondary | ICD-10-CM | POA: Diagnosis not present

## 2023-02-08 MED ORDER — KETOROLAC TROMETHAMINE 30 MG/ML IJ SOLN
30.0000 mg | Freq: Once | INTRAMUSCULAR | Status: AC
  Administered 2023-02-08: 30 mg via INTRAMUSCULAR

## 2023-02-08 MED ORDER — CLINDAMYCIN HCL 300 MG PO CAPS: 300.0000 mg | ORAL_CAPSULE | Freq: Three times a day (TID) | ORAL | 0 refills | Status: AC

## 2023-02-08 MED ORDER — ONDANSETRON 4 MG PO TBDP
4.0000 mg | ORAL_TABLET | Freq: Once | ORAL | Status: AC
  Administered 2023-02-08: 4 mg via ORAL

## 2023-02-08 NOTE — Discharge Instructions (Addendum)
  Take medication as prescribed. May take over-the-counter Tylenol Arthritis Strength 1 to 2 hours after ibuprofen for breakthrough pain. Take medication with food and water.  Warm salt water gargles 3-4 times daily until symptoms improve. Continue warm compresses to the affected area to help with pain and discomfort. I have provided a dental resource guide for you to follow-up with a dentist.  Recommend following up within the next 7 to 10 days.

## 2023-02-08 NOTE — ED Triage Notes (Signed)
Pt states she is having pain and swelling on left side of mouth. Pt states her wisdom teeth are coming in and she has a infected tooth on left side that is causing her pain. Pt also is having swelling in left side of face with neck stiffness , vomiting and diarrhea. Tooth pain started 2 weeks ago and neck stiffness with vomiting and diarrhea started 2 days ago. Taking tylenol, ibuprofen, oral gel and rexal oral pain relief.

## 2023-02-08 NOTE — ED Provider Notes (Signed)
[facility]    CSN: [id number] Arrival date & time: 02/08/23  1915      History   Chief Complaint Chief Complaint  Patient presents with   Dental Pain    HPI Lynn Nolan is a 32 y.o. female.   The history is provided by the patient.   The patient presents for complaints of dental pain has been present for the last 2 weeks.  She states over the last several days, the pain in the lower left side of her mouth is worsening.  She complains of pain with eating, talking, and chewing.  She also complains of nausea.  She denies fever, chills, chest pain, abdominal pain, vomiting, or diarrhea.  Patient states that she has a tooth in which the cavity has come out, and she also has a wisdom tooth that is growing in alongside of it.  Patient reports that she has been taking ibuprofen, Tylenol, and using Orajel for her symptoms.  She reports that she currently does not have a dentist.  Past Medical History:  Diagnosis Date   Abdominal cramping    intermittent "since BLT"   Anxiety    Chronic nausea    Depression    hx of PPD   LGA (large for gestational age) fetus    with previous pregnancy   Previous cesarean section    x2   Pyelonephritis    Rh negative, maternal    Spina bifida in child of prior pregnancy, currently pregnant     Patient Active Problem List   Diagnosis Date Noted   Chronic nausea 10/03/2017   History of pyelonephritis 09/06/2013   History of postpartum depression 09/06/2013    Past Surgical History:  Procedure Laterality Date   ADENOIDECTOMY     CESAREAN SECTION  x2   CESAREAN SECTION WITH BILATERAL TUBAL LIGATION Bilateral 10/21/2013   Procedure: PRIMARY CESAREAN SECTION WITH BILATERAL TUBAL LIGATION;  Surgeon: Esmeralda Arthur, MD;  Location: [facility];  Service: Obstetrics;  Laterality: Bilateral;  filshie clips   TONSILLECTOMY      OB History     Gravida  3   Para  3   Term  2   Preterm  1   AB      Living  4      SAB       IAB      Ectopic      Multiple  1   Live Births  1            Home Medications    Prior to Admission medications   Medication Sig Start Date End Date Taking? Authorizing Provider  clindamycin (CLEOCIN) 300 MG capsule Take 1 capsule (300 mg total) by mouth 3 (three) times daily for 7 days. 02/08/23 02/15/23 Yes Konstantinos Cordoba-Warren, Sadie Haber, NP  ibuprofen (ADVIL) 600 MG tablet Take 1 tablet (600 mg total) by mouth 3 (three) times daily. 11/27/21  Yes Idol, Raynelle Fanning, PA-C  benzonatate (TESSALON) 100 MG capsule Take 1 capsule (100 mg total) by mouth every 8 (eight) hours. 12/22/20   Wurst, [place], PA-C  brompheniramine-pseudoephedrine-DM 30-2-10 MG/5ML syrup Take 5 mLs by mouth 4 (four) times daily as needed. 02/19/22   Hershel Corkery-Warren, Sadie Haber, NP  doxycycline (VIBRA-TABS) 100 MG tablet Take 1 tablet (100 mg total) by mouth 2 (two) times daily. 02/19/22   Damone Fancher-Warren, Sadie Haber, NP  fluticasone (FLONASE) 50 MCG/ACT nasal spray Place 2 sprays into both nostrils daily. 02/19/22   Hoang Reich-Warren, Sadie Haber, NP  HYDROcodone-acetaminophen (NORCO/VICODIN) 5-325 MG tablet Take 1 tablet by mouth every 6 (six) hours as needed for severe pain. 11/27/21   Idol, Raynelle Fanning, PA-C  ondansetron (ZOFRAN-ODT) 4 MG disintegrating tablet Take 1 tablet (4 mg total) by mouth every 8 (eight) hours as needed for nausea or vomiting. 07/26/22   Gustavus Bryant, FNP    Family History Family History  Problem Relation Age of Onset   Diabetes Maternal Grandfather    Healthy Mother     Social History Social History   Tobacco Use   Smoking status: Every Day    Packs/day: 1.00    Years: 5.00    Additional pack years: 0.00    Total pack years: 5.00    Types: Cigarettes   Smokeless tobacco: Never  Vaping Use   Vaping Use: Never used  Substance Use Topics   Alcohol use: Yes    Alcohol/week: 3.0 standard drinks of alcohol    Types: 3 Cans of beer per week    Comment: weekly   Drug use: Yes    Types: Marijuana     Comment: occassionally     Allergies   Amoxicillin, Erythromycin, Penicillins, and Wellbutrin [bupropion hcl]   Review of Systems Review of Systems Per HPI  Physical Exam Triage Vital Signs ED Triage Vitals  Enc Vitals Group     BP 02/08/23 1926 111/76     Pulse Rate 02/08/23 1926 90     Resp 02/08/23 1926 17     Temp 02/08/23 1926 98 F (36.7 C)     Temp Source 02/08/23 1926 Oral     SpO2 02/08/23 1926 97 %     Weight --      Height --      Head Circumference --      Peak Flow --      Pain Score 02/08/23 1934 4     Pain Loc --      Pain Edu? --      Excl. in GC? --    No data found.  Updated Vital Signs BP 111/76 (BP Location: Right Arm)   Pulse 90   Temp 98 F (36.7 C) (Oral)   Resp 17   LMP 01/24/2023 (Approximate)   SpO2 97%   Visual Acuity Right Eye Distance:   Left Eye Distance:   Bilateral Distance:    Right Eye Near:   Left Eye Near:    Bilateral Near:     Physical Exam Vitals and nursing note reviewed.  Constitutional:      General: She is not in acute distress.    Appearance: Normal appearance.  HENT:     Head: Normocephalic.     Mouth/Throat:     Lips: Pink.     Dentition: Abnormal dentition. Dental tenderness and dental caries present. No dental abscesses.     Pharynx: Oropharynx is clear. Uvula midline. No pharyngeal swelling or posterior oropharyngeal erythema.     Comments: Abnormal dentition, dental caries, and dental tenderness noted to the left lower mouth to teeth numbers 12 and 13. Eyes:     Extraocular Movements: Extraocular movements intact.     Pupils: Pupils are equal, round, and reactive to light.  Cardiovascular:     Rate and Rhythm: Normal rate and regular rhythm.     Pulses: Normal pulses.     Heart sounds: Normal heart sounds.  Pulmonary:     Effort: Pulmonary effort is normal. No respiratory distress.     Breath sounds: Normal breath sounds.  No stridor. No wheezing, rhonchi or rales.  Abdominal:     General:  Bowel sounds are normal.     Palpations: Abdomen is soft.     Tenderness: There is no abdominal tenderness.  Musculoskeletal:     Cervical back: Normal range of motion.  Lymphadenopathy:     Cervical: No cervical adenopathy.  Skin:    General: Skin is warm and dry.  Neurological:     General: No focal deficit present.     Mental Status: She is alert and oriented to person, place, and time.  Psychiatric:        Mood and Affect: Mood normal.        Behavior: Behavior normal.      UC Treatments / Results  Labs (all labs ordered are listed, but only abnormal results are displayed) Labs Reviewed - No data to display  EKG   Radiology No results found.  Procedures Procedures (including critical care time)  Medications Ordered in UC Medications  ketorolac (TORADOL) 30 MG/ML injection 30 mg (30 mg Intramuscular Given 02/08/23 1946)  ondansetron (ZOFRAN-ODT) disintegrating tablet 4 mg (4 mg Oral Given 02/08/23 1946)    Initial Impression / Assessment and Plan / UC Course  I have reviewed the triage vital signs and the nursing notes.  Pertinent labs & imaging results that were available during my care of the patient were reviewed by me and considered in my medical decision making (see chart for details).  The patient is well-appearing, she is in no acute distress, vital signs are stable.  Patient presents for complaints of dental pain has been present for the past 2 weeks.  Patient was administered Toradol 30 mg IM for pain and given Zofran 4 mg ODT for her nausea.  Will start patient on clindamycin 300 mg 3 times daily for the next 7 days.  Supportive care recommendations were provided and discussed with the patient to include water rinses, over-the-counter Tylenol and ibuprofen for pain or discomfort, and warm compresses to the left face for pain or discomfort.  Patient was also provided a Writer guide with recommendations to follow-up with a dentist within the next 7 to  10 days.  Patient is in agreement with this plan of care and verbalizes understanding.  All questions were answered.  Patient stable for discharge.   Final Clinical Impressions(s) / UC Diagnoses   Final diagnoses:  Dentalgia     Discharge Instructions         Take medication as prescribed. May take over-the-counter Tylenol Arthritis Strength 1 to 2 hours after ibuprofen for breakthrough pain. Take medication with food and water.  Warm salt water gargles 3-4 times daily until symptoms improve. Continue warm compresses to the affected area to help with pain and discomfort. I have provided a dental resource guide for you to follow-up with a dentist.  Recommend following up within the next 7 to 10 days.      ED Prescriptions     Medication Sig Dispense Auth. Provider   clindamycin (CLEOCIN) 300 MG capsule Take 1 capsule (300 mg total) by mouth 3 (three) times daily for 7 days. 21 capsule Lekeith Wulf-Warren, Sadie Haber, NP      PDMP not reviewed this encounter.   Abran Cantor, NP 02/08/23 1956

## 2023-07-04 ENCOUNTER — Ambulatory Visit: Payer: Self-pay

## 2024-03-01 ENCOUNTER — Other Ambulatory Visit: Payer: Self-pay

## 2024-03-01 ENCOUNTER — Ambulatory Visit
Admission: RE | Admit: 2024-03-01 | Discharge: 2024-03-01 | Disposition: A | Discharge status: Home / Self Care | Source: Ambulatory Visit | Attending: Nurse Practitioner

## 2024-03-01 VITALS — BP 100/68 | HR 102 | Temp 98.0°F | Resp 20

## 2024-03-01 DIAGNOSIS — Z202 Contact with and (suspected) exposure to infections with a predominantly sexual mode of transmission: Secondary | ICD-10-CM | POA: Insufficient documentation

## 2024-03-01 DIAGNOSIS — Z113 Encounter for screening for infections with a predominantly sexual mode of transmission: Secondary | ICD-10-CM

## 2024-03-01 MED ORDER — AZITHROMYCIN 500 MG PO TABS: 2000.0000 mg | ORAL_TABLET | Freq: Once | ORAL | 0 refills | Status: AC

## 2024-03-01 MED ORDER — GENTAMICIN SULFATE 40 MG/ML IJ SOLN
240.0000 mg | Freq: Once | INTRAMUSCULAR | Status: AC
  Administered 2024-03-01: 240 mg via INTRAMUSCULAR

## 2024-03-01 NOTE — Discharge Instructions (Addendum)
 You were given an injection of gentamicin  240 mg today. Take medication as prescribed. Refrain from sexual intercourse for an additional 7 days after completing treatment.   Your cytology swab results are pending.  Notify all partners if your test results are abnormal.  You will also have access to your results via MyChart. Increase condom use with each sexual encounter. Follow-up as needed.

## 2024-03-01 NOTE — ED Provider Notes (Signed)
 [facility]    CSN: [id number] Arrival date & time: 03/01/24  1727      History   Chief Complaint Chief Complaint  Patient presents with   SEXUALLY TRANSMITTED DISEASE    Entered by patient    HPI Lynn Nolan is a 33 y.o. female.   The history is provided by the patient.   Patient presents for complaints of vaginal itching, vaginal odor, vaginal discharge, and intermittent abdominal cramping for the past several days.  Patient reports that her partner tested positive for gonorrhea 1 day ago.  She states they last had unprotected sex approximately 3 to 4 days prior.  Patient denies nausea, vomiting, fever, chills, urinary frequency, urgency, hesitancy, or low back pain.  Last menstrual cycle 02/04/2024.  Past Medical History:  Diagnosis Date   Abdominal cramping    intermittent since BLT   Anxiety    Chronic nausea    Depression    hx of PPD   LGA (large for gestational age) fetus    with previous pregnancy   Previous cesarean section    x2   Pyelonephritis    Rh negative, maternal    Spina bifida in child of prior pregnancy, currently pregnant     Patient Active Problem List   Diagnosis Date Noted   Chronic nausea 10/03/2017   History of pyelonephritis 09/06/2013   History of postpartum depression 09/06/2013    Past Surgical History:  Procedure Laterality Date   ADENOIDECTOMY     CESAREAN SECTION  x2   CESAREAN SECTION WITH BILATERAL TUBAL LIGATION Bilateral 10/21/2013   Procedure: PRIMARY CESAREAN SECTION WITH BILATERAL TUBAL LIGATION;  Surgeon: Mckinley Spells, MD;  Location: [facility];  Service: Obstetrics;  Laterality: Bilateral;  filshie clips   TONSILLECTOMY      OB History     Gravida  3   Para  3   Term  2   Preterm  1   AB      Living  4      SAB      IAB      Ectopic      Multiple  1   Live Births  1            Home Medications    Prior to Admission medications   Medication Sig Start Date End Date  Taking? Authorizing Provider  azithromycin  (ZITHROMAX ) 500 MG tablet Take 4 tablets (2,000 mg total) by mouth once for 1 dose. 03/01/24 03/01/24 Yes Leath-Warren, Belen Bowers, NP  benzonatate  (TESSALON ) 100 MG capsule Take 1 capsule (100 mg total) by mouth every 8 (eight) hours. 12/22/20   Wurst, [place], PA-C  brompheniramine-pseudoephedrine -DM 30-2-10 MG/5ML syrup Take 5 mLs by mouth 4 (four) times daily as needed. 02/19/22   Leath-Warren, Belen Bowers, NP  doxycycline  (VIBRA -TABS) 100 MG tablet Take 1 tablet (100 mg total) by mouth 2 (two) times daily. 02/19/22   Leath-Warren, Belen Bowers, NP  fluticasone  (FLONASE ) 50 MCG/ACT nasal spray Place 2 sprays into both nostrils daily. 02/19/22   Leath-Warren, Belen Bowers, NP  HYDROcodone -acetaminophen  (NORCO/VICODIN) 5-325 MG tablet Take 1 tablet by mouth every 6 (six) hours as needed for severe pain. 11/27/21   Idol, Julie, PA-C  ibuprofen  (ADVIL ) 600 MG tablet Take 1 tablet (600 mg total) by mouth 3 (three) times daily. 11/27/21   Idol, Julie, PA-C  ondansetron  (ZOFRAN -ODT) 4 MG disintegrating tablet Take 1 tablet (4 mg total) by mouth every 8 (eight) hours as needed for nausea  or vomiting. 07/26/22   Dodson Freestone, FNP    Family History Family History  Problem Relation Age of Onset   Diabetes Maternal Grandfather    Healthy Mother     Social History Social History   Tobacco Use   Smoking status: Every Day    Current packs/day: 1.00    Average packs/day: 1 pack/day for 5.0 years (5.0 ttl pk-yrs)    Types: Cigarettes   Smokeless tobacco: Never  Vaping Use   Vaping status: Never Used  Substance Use Topics   Alcohol use: Yes    Alcohol/week: 3.0 standard drinks of alcohol    Types: 3 Cans of beer per week    Comment: weekly   Drug use: Yes    Types: Marijuana    Comment: occassionally     Allergies   Amoxicillin, Erythromycin, Penicillins, and Wellbutrin [bupropion hcl]   Review of Systems Review of Systems Per HPI  Physical  Exam Triage Vital Signs ED Triage Vitals  Encounter Vitals Group     BP 03/01/24 1738 100/68     Girls Systolic BP Percentile --      Girls Diastolic BP Percentile --      Boys Systolic BP Percentile --      Boys Diastolic BP Percentile --      Pulse Rate 03/01/24 1738 (!) 102     Resp 03/01/24 1738 20     Temp 03/01/24 1738 98 F (36.7 C)     Temp Source 03/01/24 1738 Oral     SpO2 03/01/24 1738 95 %     Weight --      Height --      Head Circumference --      Peak Flow --      Pain Score 03/01/24 1737 0     Pain Loc --      Pain Education --      Exclude from Growth Chart --    No data found.  Updated Vital Signs BP 100/68 (BP Location: Right Arm)   Pulse (!) 102   Temp 98 F (36.7 C) (Oral)   Resp 20   LMP 02/04/2024 (Approximate)   SpO2 95%   Visual Acuity Right Eye Distance:   Left Eye Distance:   Bilateral Distance:    Right Eye Near:   Left Eye Near:    Bilateral Near:     Physical Exam Vitals and nursing note reviewed.  Constitutional:      Appearance: Normal appearance.  HENT:     Head: Normocephalic.   Eyes:     Extraocular Movements: Extraocular movements intact.     Pupils: Pupils are equal, round, and reactive to light.   Pulmonary:     Effort: Pulmonary effort is normal.  Genitourinary:    Comments: GU exam deferred, self swab performed   Musculoskeletal:     Cervical back: Normal range of motion.   Skin:    General: Skin is warm and dry.   Neurological:     General: No focal deficit present.     Mental Status: She is alert and oriented to person, place, and time.   Psychiatric:        Mood and Affect: Mood normal.        Behavior: Behavior normal.      UC Treatments / Results  Labs (all labs ordered are listed, but only abnormal results are displayed) Labs Reviewed - No data to display  EKG   Radiology No results found.  Procedures Procedures (including critical care time)  Medications Ordered in UC Medications   gentamicin  (GARAMYCIN ) injection 240 mg (240 mg Intramuscular Given 03/01/24 1806)    Initial Impression / Assessment and Plan / UC Course  I have reviewed the triage vital signs and the nursing notes.  Pertinent labs & imaging results that were available during my care of the patient were reviewed by me and considered in my medical decision making (see chart for details).  Cytology swab is pending.  Patient with history of penicillin allergy, patient was treated with gentamicin  240 mg IM, prescription for azithromycin  2 g sent to patient's preferred pharmacy.  Per review of patient's allergies, she is allergic to erythromycin.  Reviewed patient's chart, shows that she did receive azithromycin  in 2017, patient confirms that she did not have any adverse reaction or side effects to the medication.  Supportive care recommendations were provided and discussed with the patient to include notifying all sexual partners if test results are positive, refraining from sexual intercourse for an additional 7 days after completing treatment, and increasing condom use.  Patient was in agreement with this plan of care and verbalizes understanding.  All questions were answered.  Patient stable for discharge.  Final Clinical Impressions(s) / UC Diagnoses   Final diagnoses:  Exposure to gonorrhea  Screening examination for sexually transmitted disease     Discharge Instructions      You were given an injection of gentamicin  240 mg today. Take medication as prescribed. Refrain from sexual intercourse for an additional 7 days after completing treatment.   Your cytology swab results are pending.  Notify all partners if your test results are abnormal.  You will also have access to your results via MyChart. Increase condom use with each sexual encounter. Follow-up as needed.     ED Prescriptions     Medication Sig Dispense Auth. Provider   azithromycin  (ZITHROMAX ) 500 MG tablet Take 4 tablets (2,000 mg  total) by mouth once for 1 dose. 4 tablet Leath-Warren, Belen Bowers, NP      PDMP not reviewed this encounter.   Hardy Lia, NP 03/01/24 1815

## 2024-03-01 NOTE — ED Triage Notes (Addendum)
 Pt reports vaginal itching, odor, discharge, intermittent abdominal cramping for last several days. Reports partner tested positive for gonorrhea yesterday. Last unprotected intercourse approx 3-4 days ago.   Pt declines vaginal swab and reports would like to be treated for exposure only.

## 2024-03-04 ENCOUNTER — Ambulatory Visit (HOSPITAL_COMMUNITY): Payer: Self-pay

## 2024-03-04 LAB — CERVICOVAGINAL ANCILLARY ONLY
Bacterial Vaginitis (gardnerella): POSITIVE — AB
Candida Glabrata: NEGATIVE
Candida Vaginitis: NEGATIVE
Chlamydia: POSITIVE — AB
Comment: NEGATIVE
Comment: NEGATIVE
Comment: NEGATIVE
Comment: NEGATIVE
Comment: NEGATIVE
Comment: NORMAL
Neisseria Gonorrhea: POSITIVE — AB
Trichomonas: NEGATIVE

## 2024-08-02 ENCOUNTER — Ambulatory Visit: Payer: Self-pay

## 2024-08-03 ENCOUNTER — Telehealth: Admitting: Physician Assistant

## 2024-08-03 DIAGNOSIS — J329 Chronic sinusitis, unspecified: Secondary | ICD-10-CM

## 2024-08-03 DIAGNOSIS — J019 Acute sinusitis, unspecified: Secondary | ICD-10-CM | POA: Diagnosis not present

## 2024-08-03 MED ORDER — FLUTICASONE PROPIONATE 50 MCG/ACT NA SUSP: 2.0000 | Freq: Every day | NASAL | 0 refills | Status: AC

## 2024-08-03 MED ORDER — AZITHROMYCIN 250 MG PO TABS: ORAL_TABLET | ORAL | 0 refills | Status: AC

## 2024-08-03 NOTE — Progress Notes (Signed)
 Virtual Visit Consent   Lynn Nolan, you are scheduled for a virtual visit with a [facility] provider today. Just as with appointments in the office, your consent must be obtained to participate. Your consent will be active for this visit and any virtual visit you may have with one of our providers in the next 365 days. If you have a MyChart account, a copy of this consent can be sent to you electronically.  As this is a virtual visit, video technology does not allow for your provider to perform a traditional examination. This may limit your provider's ability to fully assess your condition. If your provider identifies any concerns that need to be evaluated in person or the need to arrange testing (such as labs, EKG, etc.), we will make arrangements to do so. Although advances in technology are sophisticated, we cannot ensure that it will always work on either your end or our end. If the connection with a video visit is poor, the visit may have to be switched to a telephone visit. With either a video or telephone visit, we are not always able to ensure that we have a secure connection.  By engaging in this virtual visit, you consent to the provision of healthcare and authorize for your insurance to be billed (if applicable) for the services provided during this visit. Depending on your insurance coverage, you may receive a charge related to this service.  I need to obtain your verbal consent now. Are you willing to proceed with your visit today? Lynn Nolan has provided verbal consent on 08/03/2024 for a virtual visit (video or telephone). Lynn Nolan, [place]  Date: 08/03/2024 3:22 PM   Virtual Visit via Video Note   I, Lynn Nolan, connected with  Lynn Nolan  ([id number], 1990/10/29) on 08/03/24 at  3:15 PM EST by a video-enabled telemedicine application and verified that I am speaking with the correct person using two identifiers.  Location: Patient: Virtual Visit  Location Patient: Home Provider: Virtual Visit Location Provider: Home Office   I discussed the limitations of evaluation and management by telemedicine and the availability of in person appointments. The patient expressed understanding and agreed to proceed.    History of Present Illness: Lynn Nolan is a 33 y.o. who identifies as a female who was assigned female at birth, and is being seen today for sinus issues.  HPI: Sinus Problem This is a new problem. The current episode started 1 to 4 weeks ago. There has been no fever. The pain is moderate. Associated symptoms include congestion and sinus pressure. The treatment provided mild relief.    Problems:  Patient Active Problem List   Diagnosis Date Noted   Chronic nausea 10/03/2017   History of pyelonephritis 09/06/2013   History of postpartum depression 09/06/2013    Allergies:  Allergies  Allergen Reactions   Amoxicillin Other (See Comments)    Childhood allergy   Erythromycin     Childhood allergy   Penicillins Other (See Comments)    Has patient had a PCN reaction causing immediate rash, facial/tongue/throat swelling, SOB or lightheadedness with hypotension: unknown Has patient had a PCN reaction causing severe rash involving mucus membranes or skin necrosis:unknown} Has patient had a PCN reaction that required hospitalization:  unknown Has patient had a PCN reaction occurring within the last 10 years: unknown   Wellbutrin [Bupropion Hcl] Nausea Only and Other (See Comments)    Dizziness   Medications:  Current Outpatient Medications:    benzonatate  (  TESSALON ) 100 MG capsule, Take 1 capsule (100 mg total) by mouth every 8 (eight) hours., Disp: 21 capsule, Rfl: 0   brompheniramine-pseudoephedrine -DM 30-2-10 MG/5ML syrup, Take 5 mLs by mouth 4 (four) times daily as needed., Disp: 140 mL, Rfl: 0   doxycycline  (VIBRA -TABS) 100 MG tablet, Take 1 tablet (100 mg total) by mouth 2 (two) times daily., Disp: 20 tablet, Rfl:  0   fluticasone  (FLONASE ) 50 MCG/ACT nasal spray, Place 2 sprays into both nostrils daily., Disp: 16 g, Rfl: 0   HYDROcodone -acetaminophen  (NORCO/VICODIN) 5-325 MG tablet, Take 1 tablet by mouth every 6 (six) hours as needed for severe pain., Disp: 12 tablet, Rfl: 0   ibuprofen  (ADVIL ) 600 MG tablet, Take 1 tablet (600 mg total) by mouth 3 (three) times daily., Disp: 15 tablet, Rfl: 0   ondansetron  (ZOFRAN -ODT) 4 MG disintegrating tablet, Take 1 tablet (4 mg total) by mouth every 8 (eight) hours as needed for nausea or vomiting., Disp: 20 tablet, Rfl: 0  Observations/Objective: Patient is well-developed, well-nourished in no acute distress.  Resting comfortably  at home.  Head is normocephalic, atraumatic.  No labored breathing.  Speech is clear and coherent with logical content.  Patient is alert and oriented at baseline.    Assessment and Plan: 1. Sinusitis, unspecified chronicity, unspecified location (Primary)  Presentation consistent with sinusitis.  No evidence of other bacterial infections including pneumonia, meningitis, pharyngitis, otitis media.  Discussed that this fits picture of  bacterial sinus itis and that due to type and duration of symptoms and exam findings, we will treat as bacterial sinusitis.  Antibiotics prescribed. Advised to continue ibuprofen  and Tylenol  at home. Patient is to follow up with primary physician if having continued symptoms.   Advised patient on supportive therapies, including using a cool-mist vaporizer/humidifier/steam from hot showers, OTC throat lozenges, advancement of fluids as tolerated, nasal saline sprays, rest, OTC acetaminophen  or ibuprofen  for pain control, frequent handwashing.  Follow Up Instructions: I discussed the assessment and treatment plan with the patient. The patient was provided an opportunity to ask questions and all were answered. The patient agreed with the plan and demonstrated an understanding of the instructions.  A copy of  instructions were sent to the patient via MyChart unless otherwise noted below.    The patient was advised to call back or seek an in-person evaluation if the symptoms worsen or if the condition fails to improve as anticipated.    Lynn Shuck, PA-C

## 2024-08-03 NOTE — Patient Instructions (Signed)
 Vickye D Tully, thank you for joining [name], PA-C for today's virtual visit.  While this provider is not your primary care provider (PCP), if your PCP is located in our provider database this encounter information will be shared with them immediately following your visit.   A [facility] MyChart account gives you access to today's visit and all your visits, tests, and labs performed at [facility]  click here if you don't have a [facility] MyChart account or go to [email or web address]  Consent: (Patient) Lynn Nolan provided verbal consent for this virtual visit at the beginning of the encounter.  Current Medications:  Current Outpatient Medications:    azithromycin  (ZITHROMAX ) 250 MG tablet, Take 2 tablets on day 1, then 1 tablet daily on days 2 through 5, Disp: 6 tablet, Rfl: 0   benzonatate  (TESSALON ) 100 MG capsule, Take 1 capsule (100 mg total) by mouth every 8 (eight) hours., Disp: 21 capsule, Rfl: 0   brompheniramine-pseudoephedrine -DM 30-2-10 MG/5ML syrup, Take 5 mLs by mouth 4 (four) times daily as needed., Disp: 140 mL, Rfl: 0   doxycycline  (VIBRA -TABS) 100 MG tablet, Take 1 tablet (100 mg total) by mouth 2 (two) times daily., Disp: 20 tablet, Rfl: 0   fluticasone  (FLONASE ) 50 MCG/ACT nasal spray, Place 2 sprays into both nostrils daily., Disp: 16 g, Rfl: 0   HYDROcodone -acetaminophen  (NORCO/VICODIN) 5-325 MG tablet, Take 1 tablet by mouth every 6 (six) hours as needed for severe pain., Disp: 12 tablet, Rfl: 0   ibuprofen  (ADVIL ) 600 MG tablet, Take 1 tablet (600 mg total) by mouth 3 (three) times daily., Disp: 15 tablet, Rfl: 0   ondansetron  (ZOFRAN -ODT) 4 MG disintegrating tablet, Take 1 tablet (4 mg total) by mouth every 8 (eight) hours as needed for nausea or vomiting., Disp: 20 tablet, Rfl: 0   Medications ordered in this encounter:  Meds ordered this encounter  Medications   azithromycin  (ZITHROMAX ) 250 MG tablet    Sig: Take 2  tablets on day 1, then 1 tablet daily on days 2 through 5    Dispense:  6 tablet    Refill:  0    Supervising Provider:   LAMPTEY, PHILIP O [[id number]]   fluticasone  (FLONASE ) 50 MCG/ACT nasal spray    Sig: Place 2 sprays into both nostrils daily.    Dispense:  16 g    Refill:  0    Supervising Provider:   BLAISE ALEENE KIDD [[id number]]     *If you need refills on other medications prior to your next appointment, please contact your pharmacy*  Follow-Up: Call back or seek an in-person evaluation if the symptoms worsen or if the condition fails to improve as anticipated.  [facility] [phone]  Other Instructions Follow up with primary provider in 24-48 hours. Report to nearest ER with any worsening symptoms.    If you have been instructed to have an in-person evaluation today at a local Urgent Care facility, please use the link below. It will take you to a list of all of our available [facility], including address, phone number and hours of operation. Please do not delay care.  [facility]  If you or a family member do not have a primary care provider, use the link below to schedule a visit and establish care. When you choose a [facility] primary care physician or advanced practice provider, you gain a long-term partner in health. Find a Primary Care Provider  Learn more about  [facility]'s in-office and virtual care options: [facility] Get Care Now

## 2024-09-22 NOTE — Addendum Note (Signed)
 Addended byBETHA ROLAN BERTHOLD on: 09/22/2024 10:11 AM   Modules accepted: Level of Service
